# Patient Record
Sex: Male | Born: 1995 | Race: White | Hispanic: No | Marital: Single | Smoking: Former smoker
Health system: Southern US, Community
[De-identification: ages and names within clinical notes are randomized; demographics above are authoritative.]

## PROBLEM LIST (undated history)

## (undated) DIAGNOSIS — T7840XA Allergy, unspecified, initial encounter: Secondary | ICD-10-CM

## (undated) DIAGNOSIS — M797 Fibromyalgia: Secondary | ICD-10-CM

## (undated) DIAGNOSIS — F319 Bipolar disorder, unspecified: Secondary | ICD-10-CM

## (undated) DIAGNOSIS — F909 Attention-deficit hyperactivity disorder, unspecified type: Secondary | ICD-10-CM

## (undated) HISTORY — PX: TYMPANOSTOMY TUBE PLACEMENT: SHX32

## (undated) HISTORY — DX: Allergy, unspecified, initial encounter: T78.40XA

## (undated) HISTORY — DX: Fibromyalgia: M79.7

---
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
## 2017-01-04 ENCOUNTER — Ambulatory Visit (INDEPENDENT_AMBULATORY_CARE_PROVIDER_SITE_OTHER): Payer: 59 | Admitting: Family Medicine

## 2017-01-04 VITALS — BP 126/82 | HR 81 | Temp 97.8°F | Wt 157.4 lb

## 2017-01-04 DIAGNOSIS — R1013 Epigastric pain: Secondary | ICD-10-CM | POA: Diagnosis not present

## 2017-01-04 DIAGNOSIS — R5383 Other fatigue: Secondary | ICD-10-CM

## 2017-01-04 DIAGNOSIS — Z79899 Other long term (current) drug therapy: Secondary | ICD-10-CM

## 2017-01-04 DIAGNOSIS — F319 Bipolar disorder, unspecified: Secondary | ICD-10-CM

## 2017-01-04 DIAGNOSIS — G8929 Other chronic pain: Secondary | ICD-10-CM

## 2017-01-04 DIAGNOSIS — M5442 Lumbago with sciatica, left side: Secondary | ICD-10-CM | POA: Diagnosis not present

## 2017-01-04 MED ORDER — GI COCKTAIL ~~LOC~~
30.0000 mL | Freq: Once | ORAL | Status: AC
  Administered 2017-01-04: 30 mL via ORAL

## 2017-01-04 NOTE — Progress Notes (Signed)
George Pham is a 21 y.o. male [facility]   Patient Care Team: George Pham, George Eaves, DO as PCP - General (Family Medicine)   History of Present Illness:   HPI: See Assessment and Plan section for Problem Based Charting of issues discussed today.  Health Maintenance Due  Topic Date Due  . TETANUS/TDAP  06/11/2016  . INFLUENZA VACCINE  09/08/2016   Depression screen PHQ 2/9 01/04/2017  Decreased Interest 0  Down, Depressed, Hopeless 1  PHQ - 2 Score 1   PMHx, SurgHx, SocialHx, Medications, and Allergies were reviewed in the Visit Navigator and updated as appropriate.   Past Medical History:  Diagnosis Date  . ADHD (attention deficit hyperactivity disorder)   . Allergy   . Bipolar 1 disorder (HCC)    Followed by Dr. Yetta Pham    Past Surgical History:  Procedure Laterality Date  . TYMPANOSTOMY TUBE PLACEMENT      Family History  Problem Relation Age of Onset  . Hypertension Mother   . Cancer Mother   . Cancer Father   . Hyperlipidemia Father   . Cancer Other    Social History   Tobacco Use  . Smoking status: Light Tobacco Smoker  . Smokeless tobacco: Never Used  Substance Use Topics  . Alcohol use: No    Alcohol/week: 0.0 oz  . Drug use: Yes    Types: Marijuana   Current Medications and Allergies:   .  ARIPiprazole (ABILIFY) 15 MG tablet, Take 15 mg by mouth daily., Disp: , Rfl:  .  EPINEPHrine (EPI-PEN) 0.3 mg/0.3 mL SOAJ injection, Inject into the muscle once., Disp: , Rfl:  .  fexofenadine (ALLEGRA) 30 MG tablet, Take 30 mg by mouth daily., Disp: , Rfl:  .  omeprazole (PRILOSEC) 20 MG capsule, Take 20 mg by mouth daily., Disp: , Rfl:   Allergies  Allergen Reactions  . Peanuts [Peanut Oil]     "Ears turn red."    Review of Systems:   Pertinent items are noted in the HPI. Otherwise, ROS is negative.  Vitals:   Vitals:   01/04/17 1041  BP: 126/82  Pulse: 81  Temp: 97.8 F (36.6 C)  TempSrc: Oral  Weight: 157 lb 6.4 oz (71.4 kg)      Body mass index is 23.52 kg/m.   Physical Exam:   Physical Exam  Constitutional: He is oriented to person, place, and time. He appears well-developed and well-nourished. No distress.  HENT:  Head: Normocephalic and atraumatic.  Right Ear: External ear normal.  Left Ear: External ear normal.  Nose: Nose normal.  Mouth/Throat: Oropharynx is clear and moist.  Eyes: Conjunctivae and EOM are normal. Pupils are equal, round, and reactive to light.  Neck: Normal range of motion. Neck supple.  Cardiovascular: Normal rate, regular rhythm, normal heart sounds and intact distal pulses.  Pulmonary/Chest: Effort normal and breath sounds normal.  Abdominal: Soft. Bowel sounds are normal.  Musculoskeletal: Normal range of motion.  Neurological: He is alert and oriented to person, place, and time.  Skin: Skin is warm and dry.  Psychiatric: He has a normal mood and affect. His behavior is normal. Judgment and thought content normal.  Nursing note and vitals reviewed.  Assessment and Plan:   George Pham was seen today for establish care and emesis.  Diagnoses and all orders for this visit:  Epigastric pain Comments: None today.  The patient states that he has a long history of GI complaints.  He denies any nausea, vomiting,  diarrhea, constipation.  No hematochezia, hematemesis, or melena.  Upcoming appointment with GI - Dr. Dulce Pham.   -     gi cocktail (Maalox,Lidocaine,Donnatal)  Medication management Comments: Patient is on a second generation antipsychotic.  Will monitor for metabolic syndrome. Orders: -     CBC; Future -     Comprehensive metabolic panel; Future -     Lipid panel; Future -     TSH; Future  Fatigue, unspecified type Comments: Patient does seem very tired today.  I am not aware of his baseline.  We discussed sleep hygiene.  Labs pending.  He will follow-up with a psychiatrist regarding his Abilify dosage.  Chronic left-sided low back pain with left-sided  sciatica Comments: None today.  Patient has a history of left-sided lumbar pain with intermittent left sciatica.  He denies any trauma recently or remotely.  He does endorse some mild scoliosis.  We reviewed exercises to complete to strengthen the low back.  Bipolar 1 disorder (HCC) Comments: Followed by Psych - Dr. Yetta Pham.  See above.  He denies any SI/HI today.  . Reviewed expectations re: course of current medical issues. . Discussed self-management of symptoms. . Outlined signs and symptoms indicating need for more acute intervention. . Patient verbalized understanding and all questions were answered. Marland Kitchen. Health Maintenance issues including appropriate healthy diet, exercise, and smoking avoidance were discussed with patient. . See orders for this visit as documented in the electronic medical record. . Patient received an After Visit Summary.  Records requested if needed. Time spent with the patient: 45 minutes, of which >50% was spent in obtaining information about his symptoms, reviewing his previous labs, evaluations, and treatments, counseling him about his condition (please see the discussed topics above), and developing a plan to further investigate it; he had a number of questions which I addressed.    George RimaErica Antwane Grose, DO [place] 01/09/2017

## 2017-01-06 ENCOUNTER — Encounter: Payer: Self-pay | Admitting: Family Medicine

## 2017-01-06 DIAGNOSIS — G8929 Other chronic pain: Secondary | ICD-10-CM | POA: Insufficient documentation

## 2017-01-06 DIAGNOSIS — R1013 Epigastric pain: Secondary | ICD-10-CM | POA: Insufficient documentation

## 2017-01-06 DIAGNOSIS — M5442 Lumbago with sciatica, left side: Secondary | ICD-10-CM

## 2017-01-06 DIAGNOSIS — Z79899 Other long term (current) drug therapy: Secondary | ICD-10-CM | POA: Insufficient documentation

## 2017-10-06 ENCOUNTER — Ambulatory Visit (INDEPENDENT_AMBULATORY_CARE_PROVIDER_SITE_OTHER): Payer: 59 | Admitting: Psychology

## 2017-10-06 DIAGNOSIS — F3181 Bipolar II disorder: Secondary | ICD-10-CM

## 2017-10-26 ENCOUNTER — Ambulatory Visit (INDEPENDENT_AMBULATORY_CARE_PROVIDER_SITE_OTHER): Payer: 59 | Admitting: Family Medicine

## 2017-10-26 ENCOUNTER — Encounter: Payer: Self-pay | Admitting: Family Medicine

## 2017-10-26 VITALS — BP 122/80 | HR 62 | Temp 98.1°F | Ht 70.0 in | Wt 147.4 lb

## 2017-10-26 DIAGNOSIS — J302 Other seasonal allergic rhinitis: Secondary | ICD-10-CM | POA: Diagnosis not present

## 2017-10-26 DIAGNOSIS — R5383 Other fatigue: Secondary | ICD-10-CM

## 2017-10-26 DIAGNOSIS — R5381 Other malaise: Secondary | ICD-10-CM

## 2017-10-26 DIAGNOSIS — Z0000 Encounter for general adult medical examination without abnormal findings: Secondary | ICD-10-CM | POA: Diagnosis not present

## 2017-10-26 DIAGNOSIS — F319 Bipolar disorder, unspecified: Secondary | ICD-10-CM

## 2017-10-26 DIAGNOSIS — Z79899 Other long term (current) drug therapy: Secondary | ICD-10-CM

## 2017-10-26 DIAGNOSIS — Z23 Encounter for immunization: Secondary | ICD-10-CM

## 2017-10-26 LAB — COMPREHENSIVE METABOLIC PANEL
ALT: 12 U/L (ref 0–53)
AST: 13 U/L (ref 0–37)
Albumin: 4.6 g/dL (ref 3.5–5.2)
Alkaline Phosphatase: 56 U/L (ref 39–117)
BUN: 13 mg/dL (ref 6–23)
CO2: 31 mEq/L (ref 19–32)
Calcium: 9.9 mg/dL (ref 8.4–10.5)
Chloride: 103 mEq/L (ref 96–112)
Creatinine, Ser: 0.69 mg/dL (ref 0.40–1.50)
GFR: 154.79 mL/min (ref >60.00)
Glucose, Bld: 88 mg/dL (ref 70–99)
Potassium: 4.2 mEq/L (ref 3.5–5.1)
Sodium: 138 mEq/L (ref 135–145)
Total Bilirubin: 1 mg/dL (ref 0.2–1.2)
Total Protein: 7.3 g/dL (ref 6.0–8.3)

## 2017-10-26 LAB — LIPID PANEL
Cholesterol: 103 mg/dL (ref 0–200)
HDL: 40.1 mg/dL (ref >39.00)
LDL Cholesterol: 54 mg/dL (ref 0–99)
NonHDL: 63.19
Total CHOL/HDL Ratio: 3
Triglycerides: 47 mg/dL (ref 0.0–149.0)
VLDL: 9.4 mg/dL (ref 0.0–40.0)

## 2017-10-26 LAB — CBC
HCT: 43.4 % (ref 39.0–52.0)
Hemoglobin: 14.9 g/dL (ref 13.0–17.0)
MCHC: 34.3 g/dL (ref 30.0–36.0)
MCV: 93.4 fl (ref 78.0–100.0)
Platelets: 240 10*3/uL (ref 150.0–400.0)
RBC: 4.65 Mil/uL (ref 4.22–5.81)
RDW: 12.6 % (ref 11.5–14.6)
WBC: 6 10*3/uL (ref 4.5–10.5)

## 2017-10-26 LAB — TSH: TSH: 1.78 u[IU]/mL (ref 0.35–5.50)

## 2017-10-26 NOTE — Progress Notes (Signed)
Subjective:    George Pham is a 22 y.o. male who presents today for his Complete Annual Exam.   Current Outpatient Medications:  .  ARIPiprazole (ABILIFY) 15 MG tablet, Take 15 mg by mouth daily., Disp: , Rfl:  .  clonazePAM (KLONOPIN) 1 MG tablet, , Disp: , Rfl:  .  EPINEPHrine (EPI-PEN) 0.3 mg/0.3 mL SOAJ injection, Inject into the muscle once., Disp: , Rfl:  .  fexofenadine (ALLEGRA) 30 MG tablet, Take 30 mg by mouth daily., Disp: , Rfl:  .  omeprazole (PRILOSEC) 20 MG capsule, Take 20 mg by mouth daily., Disp: , Rfl:   Health Maintenance Due  Topic Date Due  . TETANUS/TDAP  06/11/2016  . INFLUENZA VACCINE  09/08/2017   PMHx, SurgHx, SocialHx, Medications, and Allergies were reviewed in the Visit Navigator and updated as appropriate.   Past Medical History:  Diagnosis Date  . ADHD (attention deficit hyperactivity disorder)   . Allergy   . Bipolar 1 disorder (HCC)    Followed by Dr. Yetta Barre     Past Surgical History:  Procedure Laterality Date  . TYMPANOSTOMY TUBE PLACEMENT       Family History  Problem Relation Age of Onset  . Hypertension Mother   . Cancer Mother   . Cancer Father   . Hyperlipidemia Father   . Cancer Other     Social History   Tobacco Use  . Smoking status: Light Tobacco Smoker  . Smokeless tobacco: Never Used  Substance Use Topics  . Alcohol use: No    Alcohol/week: 0.0 standard drinks  . Drug use: Yes    Types: Marijuana    Review of Systems:   Pertinent items are noted in the HPI. Otherwise, ROS is negative.  Objective:   Vitals:   10/26/17 1053  BP: 122/80  Pulse: 62  Temp: 98.1 F (36.7 C)  SpO2: 99%   Body mass index is 21.15 kg/m.  General Appearance:  Alert, cooperative, no distress, appears stated age  Head:  Normocephalic, without obvious abnormality, atraumatic  Eyes:  PERRL, conjunctiva/corneas clear, EOM's intact, fundi benign, both eyes       Ears:  Normal TM's and external ear canals, both ears    Nose: Nares normal, septum midline, mucosa normal, no drainage    or sinus tenderness  Throat: Lips, mucosa, and tongue normal; teeth and gums normal  Neck: Supple, symmetrical, trachea midline, no adenopathy; thyroid:  No enlargement/tenderness/nodules; no carotit bruit or JVD  Back:   Symmetric, no curvature, ROM normal, no CVA tenderness  Lungs:   Clear to auscultation bilaterally, respirations unlabored  Chest wall:  No tenderness or deformity  Heart:  Regular rate and rhythm, S1 and S2 normal, no murmur, rub   or gallop  Abdomen:   Soft, non-tender, bowel sounds active all four quadrants, no masses, no organomegaly  Extremities: Extremities normal, atraumatic, no cyanosis or edema  Prostate: Not done.   Skin: Skin color, texture, turgor normal, no rashes or lesions  Lymph nodes: Cervical, supraclavicular, and axillary nodes normal  Neurologic: CNII-XII grossly intact. Normal strength, sensation and reflexes throughout    Assessment/Plan:   Diagnoses and all orders for this visit:  Routine physical examination  Medication management Comments: On antipsychotic.  Orders: -     TSH -     Lipid panel -     Comprehensive metabolic panel -     CBC  Need for prophylactic vaccination against diphtheria-tetanus-pertussis (DTP) -     Tdap  vaccine greater than or equal to 7yo IM  Bipolar 1 disorder (HCC)  Seasonal allergies  Malaise and fatigue -     TSH -     Comprehensive metabolic panel -     CBC   Patient Counseling: [x]   Nutrition: Stressed importance of moderation in sodium/caffeine intake, saturated fat and cholesterol, caloric balance, sufficient intake of fresh fruits, vegetables, and fiber.  [x]   Stressed the importance of regular exercise.   []   Substance Abuse: Discussed cessation/primary prevention of tobacco, alcohol, or other drug use; driving or other dangerous activities under the influence; availability of treatment for abuse.   [x]   Injury prevention:  Discussed safety belts, safety helmets, smoke detector, smoking near bedding or upholstery.   []   Sexuality: Discussed sexually transmitted diseases, partner selection, use of condoms, avoidance of unintended pregnancy and contraceptive alternatives.   [x]   Dental health: Discussed importance of regular tooth brushing, flossing, and dental visits.  [x]   Health maintenance and immunizations reviewed. Please refer to Health maintenance section.    Helane RimaErica Yasaman Kolek, DO [place] [facility]

## 2017-10-31 ENCOUNTER — Ambulatory Visit: Payer: 59 | Admitting: Psychology

## 2017-11-16 ENCOUNTER — Ambulatory Visit: Payer: 59 | Admitting: Psychology

## 2017-11-21 ENCOUNTER — Ambulatory Visit: Payer: Self-pay | Admitting: Psychology

## 2018-03-27 ENCOUNTER — Telehealth: Payer: Self-pay | Admitting: Family Medicine

## 2018-03-27 NOTE — Telephone Encounter (Signed)
Called mom let her know that he does not at this time

## 2018-03-27 NOTE — Telephone Encounter (Signed)
Copied from CRM [id number]. Topic: General - Other >> Mar 27, 2018 11:39 AM Gaynelle Adu wrote: Reason for CRM: patient mother is calling to request a Tdap  shot. Please advise

## 2018-08-09 ENCOUNTER — Telehealth: Payer: Self-pay
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
## 2018-08-09 NOTE — Telephone Encounter (Signed)
Noted  
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
## 2018-08-10 ENCOUNTER — Telehealth: Payer: Self-pay | Admitting: Family Medicine

## 2018-08-10 ENCOUNTER — Ambulatory Visit (INDEPENDENT_AMBULATORY_CARE_PROVIDER_SITE_OTHER): Payer: 59

## 2018-08-10 ENCOUNTER — Ambulatory Visit (INDEPENDENT_AMBULATORY_CARE_PROVIDER_SITE_OTHER): Payer: 59 | Admitting: Physician Assistant

## 2018-08-10 ENCOUNTER — Encounter: Payer: Self-pay | Admitting: Physician Assistant

## 2018-08-10 ENCOUNTER — Other Ambulatory Visit: Payer: Self-pay

## 2018-08-10 VITALS — BP 126/78 | HR 64 | Temp 97.7°F | Ht 70.0 in | Wt 150.2 lb

## 2018-08-10 DIAGNOSIS — M25562 Pain in left knee: Secondary | ICD-10-CM | POA: Diagnosis not present

## 2018-08-10 DIAGNOSIS — M25512 Pain in left shoulder: Secondary | ICD-10-CM

## 2018-08-10 DIAGNOSIS — T07XXXA Unspecified multiple injuries, initial encounter: Secondary | ICD-10-CM

## 2018-08-10 MED ORDER — MELOXICAM 15 MG PO TABS: 15.0000 mg | ORAL_TABLET | Freq: Every day | ORAL | 0 refills | Status: DC

## 2018-08-10 NOTE — Telephone Encounter (Signed)
Patient returning call for results. CRM was created by office for nurse triage to be able to give results if patient calls back. Please advise.
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
## 2018-08-10 NOTE — Patient Instructions (Signed)
It was great to see you!  Ice is your friend!  Start daily mobic and take for at least one week to help with inflammation. This will replace ibuprofen.  You will be contacted about your referral to sports medicine and your xray results.  Take care,  Inda Coke PA-C

## 2018-09-12 ENCOUNTER — Ambulatory Visit: Payer: 59 | Admitting: Family Medicine

## 2018-11-09 ENCOUNTER — Ambulatory Visit (INDEPENDENT_AMBULATORY_CARE_PROVIDER_SITE_OTHER): Payer: 59 | Admitting: Family Medicine

## 2018-11-09 ENCOUNTER — Other Ambulatory Visit: Payer: Self-pay

## 2018-11-09 ENCOUNTER — Encounter: Payer: Self-pay | Admitting: Family Medicine

## 2018-11-09 VITALS — Ht 70.0 in | Wt 150.0 lb

## 2018-11-09 DIAGNOSIS — K602 Anal fissure, unspecified: Secondary | ICD-10-CM

## 2018-11-09 MED ORDER — MUPIROCIN 2 % EX OINT: 1.0000 "application " | TOPICAL_OINTMENT | Freq: Two times a day (BID) | CUTANEOUS | 0 refills | Status: DC

## 2018-11-09 MED ORDER — LIDOCAINE 5 % EX OINT: 1.0000 "application " | TOPICAL_OINTMENT | CUTANEOUS | 0 refills | Status: DC | PRN

## 2018-11-09 NOTE — Patient Instructions (Signed)
Anal Fissure, Adult  An anal fissure is a small tear or crack in the tissue around the opening of the butt (anus). Bleeding from the tear or crack usually stops on its own within a few minutes. The bleeding may happen every time you poop (have a bowel movement) until the tear or crack heals. What are the causes? This condition is usually caused by passing a large or hard poop (stool). Other causes include:  Trouble pooping (constipation).  Passing watery poop (diarrhea).  Inflammatory bowel disease (Crohn's disease or ulcerative colitis).  Childbirth.  Infections.  Anal sex. What are the signs or symptoms? Symptoms of this condition include:  Bleeding from the butt.  Small amounts of blood on your poop. The blood coats the outside of the poop. It is not mixed with the poop.  Small amounts of blood on the toilet paper or in the toilet after you poop.  Pain when passing poop.  Itching or irritation around the opening of the butt. How is this diagnosed? This condition may be diagnosed based on a physical exam. Your doctor may:  Check your butt. A tear can often be seen by checking the area with care.  Check your butt using a short tube (anoscope). The light in the tube will show any problems in your butt. How is this treated? Treatment for this condition may include:  Treating problems that make it hard for you to pass poop. You may be told to: ? Eat more fiber. ? Drink more fluid. ? Take fiber supplements. ? Take medicines that make poop soft.  Taking sitz baths. This may help to heal the tear.  Using creams and ointments. If your condition gets worse, other treatments may be needed such as:  A shot near the tear or crack (botulinum injection).  Surgery to repair the tear or crack. Follow these instructions at home: Eating and drinking   Avoid bananas and dairy products. These foods can make it hard to poop.  Drink enough fluid to keep your pee (urine) pale  yellow.  Eat foods that have a lot of fiber in them, such as: ? Beans. ? Whole grains. ? Fresh fruits. ? Fresh vegetables. General instructions   Take over-the-counter and prescription medicines only as told by your doctor.  Use creams or ointments only as told by your doctor.  Keep the butt area as clean and dry as you can.  Take a warm water bath (sitz bath) as told by your doctor. Do not use soap.  Keep all follow-up visits as told by your doctor. This is important. Contact a doctor if:  You have more bleeding.  You have a fever.  You have watery poop that is mixed with blood.  You have pain.  Your problem gets worse, not better. Summary  An anal fissure is a small tear or crack in the skin around the opening of the butt (anus).  This condition is usually caused by passing a large or hard poop (stool).  Treatment includes treating the problems that make it hard for you to pass poop.  Follow your doctor's instructions about caring for your condition at home.  Keep all follow-up visits as told by your doctor. This is important. This information is not intended to replace advice given to you by your health care provider. Make sure you discuss any questions you have with your health care provider. Document Released: 09/23/2010 Document Revised: 07/07/2017 Document Reviewed: 07/07/2017 Elsevier Patient Education  2020 [employer]  

## 2018-11-09 NOTE — Progress Notes (Signed)
Virtual Visit via Video   Due to the COVID-19 pandemic, this visit was completed with telemedicine (audio/video) technology to reduce patient and provider exposure as well as to preserve personal protective equipment.   I connected with George Pham by a video enabled telemedicine application and verified that I am speaking with the correct person using two identifiers. Location patient: Home Location provider: [facility], Office Persons participating in the virtual visit: George Pham, George Deutscher, DO   I discussed the limitations of evaluation and management by telemedicine and the availability of in person appointments. The patient expressed understanding and agreed to proceed.  Care Team   Patient Care Team: George Deutscher, DO as PCP - General (Family Medicine)  Subjective:   HPI: Ongoing rectal pain for about a week. Has increased in the last week. Has tried over the counter medications and creams with no help. No blood in stool has had some discomfort with bowel movements. He feel like it may be a more of a fissure. Nt history of this in the past.    Review of Systems  Constitutional: Negative for chills and fever.  HENT: Negative for hearing loss and tinnitus.   Eyes: Negative for blurred vision and double vision.  Respiratory: Negative for cough.   Cardiovascular: Negative for chest pain, palpitations and leg swelling.  Gastrointestinal: Negative for nausea and vomiting.  Genitourinary: Negative for dysuria and urgency.  Neurological: Negative for dizziness and headaches.  Psychiatric/Behavioral: Negative for depression and suicidal ideas.    Patient Active Problem List   Diagnosis Date Noted  . Chronic left-sided low back pain with left-sided sciatica 01/06/2017  . Epigastric pain 01/06/2017  . Medication management 01/06/2017  . Attention deficit hyperactivity disorder (ADHD) 06/16/2015  . Bipolar 1 disorder ([place]) 06/16/2015  . Seasonal allergies  06/16/2015    Social History   Tobacco Use  . Smoking status: Light Tobacco Smoker  . Smokeless tobacco: Never Used  Substance Use Topics  . Alcohol use: No    Alcohol/week: 0.0 standard drinks    Current Outpatient Medications:  .  ABILIFY 30 MG tablet, Take 30 mg by mouth daily., Disp: , Rfl:  .  clonazePAM (KLONOPIN) 1 MG tablet, , Disp: , Rfl:  .  EPINEPHrine (EPI-PEN) 0.3 mg/0.3 mL SOAJ injection, Inject into the muscle once., Disp: , Rfl:  .  fexofenadine (ALLEGRA) 30 MG tablet, Take 30 mg by mouth daily., Disp: , Rfl:  .  hydrOXYzine (VISTARIL) 50 MG capsule, TAKE 1 CAPSULE BY MOUTH 4 TIMES A DAY, Disp: , Rfl:  .  meloxicam (MOBIC) 15 MG tablet, Take 1 tablet (15 mg total) by mouth daily., Disp: 30 tablet, Rfl: 0 .  omeprazole (PRILOSEC) 20 MG capsule, Take 20 mg by mouth daily., Disp: , Rfl:  .  lidocaine (XYLOCAINE) 5 % ointment, Apply 1 application topically as needed., Disp: 35.44 g, Rfl: 0 .  mupirocin ointment (BACTROBAN) 2 %, Place 1 application into the nose 2 (two) times daily., Disp: 22 g, Rfl: 0  Allergies  Allergen Reactions  . Peanuts [Peanut Oil]     "Ears turn red."     Objective:   VITALS: Per patient if applicable, see vitals. GENERAL: Alert, appears well and in no acute distress. HEENT: Atraumatic, conjunctiva clear, no obvious abnormalities on inspection of external nose and ears. NECK: Normal movements of the head and neck. CARDIOPULMONARY: No increased WOB. Speaking in clear sentences. I:E ratio WNL.  MS: Moves all visible extremities without noticeable  abnormality. PSYCH: Pleasant and cooperative, well-groomed. Speech normal rate and rhythm. Affect is appropriate. Insight and judgement are appropriate. Attention is focused, linear, and appropriate.  NEURO: CN grossly intact. Oriented as arrived to appointment on time with no prompting. Moves both UE equally.  SKIN: No obvious lesions, wounds, erythema, or cyanosis noted on face or  hands.  Depression screen [facility] 2/9 01/04/2017 12/18/2015 06/16/2015  Decreased Interest 0 0 0  Down, Depressed, Hopeless 1 1 0  PHQ - 2 Score 1 1 0    Assessment and Plan:   George Pham was seen today for hemorrhoids.  Diagnoses and all orders for this visit:  Anal fissure -     lidocaine (XYLOCAINE) 5 % ointment; Apply 1 application topically as needed. -     mupirocin ointment (BACTROBAN) 2 %; Place 1 application into the nose 2 (two) times daily.   Marland Kitchen COVID-19 Education: The signs and symptoms of COVID-19 were discussed with the patient and how to seek care for testing if needed. The importance of social distancing was discussed today. . Reviewed expectations re: course of current medical issues. . Discussed self-management of symptoms. . Outlined signs and symptoms indicating need for more acute intervention. . Patient verbalized understanding and all questions were answered. Marland Kitchen Health Maintenance issues including appropriate healthy diet, exercise, and smoking avoidance were discussed with patient. . See orders for this visit as documented in the electronic medical record.  Helane Rima, DO  Records requested if needed. Time spent: 15 minutes, of which >50% was spent in obtaining information about his symptoms, reviewing his previous labs, evaluations, and treatments, counseling him about his condition (please see the discussed topics above), and developing a plan to further investigate it; he had a number of questions which I addressed.

## 2018-11-17 ENCOUNTER — Other Ambulatory Visit: Payer: Self-pay | Admitting: Family Medicine

## 2018-11-17 DIAGNOSIS — K602 Anal fissure, unspecified: Secondary | ICD-10-CM

## 2018-11-30 ENCOUNTER — Other Ambulatory Visit: Payer: Self-pay | Admitting: Family Medicine

## 2018-11-30 DIAGNOSIS — K602 Anal fissure, unspecified: Secondary | ICD-10-CM

## 2018-12-08 ENCOUNTER — Ambulatory Visit: Payer: 59 | Admitting: Family Medicine

## 2018-12-18 ENCOUNTER — Other Ambulatory Visit: Payer: Self-pay | Admitting: Family Medicine

## 2018-12-18 DIAGNOSIS — K602 Anal fissure, unspecified: Secondary | ICD-10-CM

## 2018-12-20 NOTE — Telephone Encounter (Signed)
Refilled denied. Please contact pt to schedule with new PCP.

## 2018-12-22 NOTE — Telephone Encounter (Signed)
Patient has been scheduled with Dr. Rogers Blocker 02/14/19

## 2019-02-13 ENCOUNTER — Other Ambulatory Visit: Payer: Self-pay

## 2019-02-14 ENCOUNTER — Encounter: Payer: Self-pay | Admitting: Family Medicine

## 2019-02-14 ENCOUNTER — Ambulatory Visit (INDEPENDENT_AMBULATORY_CARE_PROVIDER_SITE_OTHER): Payer: Managed Care, Other (non HMO) | Admitting: Family Medicine

## 2019-02-14 VITALS — BP 103/64 | HR 70 | Temp 97.8°F | Ht 70.0 in | Wt 135.6 lb

## 2019-02-14 DIAGNOSIS — Z0000 Encounter for general adult medical examination without abnormal findings: Secondary | ICD-10-CM | POA: Diagnosis not present

## 2019-02-14 DIAGNOSIS — G44209 Tension-type headache, unspecified, not intractable: Secondary | ICD-10-CM

## 2019-02-14 DIAGNOSIS — M2550 Pain in unspecified joint: Secondary | ICD-10-CM | POA: Diagnosis not present

## 2019-02-14 NOTE — Progress Notes (Signed)
Patient: George Pham MRN: [id number] DOB: 02-Oct-1995 PCP: Orland Mustard, MD     Subjective:  Chief Complaint  Patient presents with  . Transitions Of Care  . Annual Exam  . Headache  . body pain    HPI: The patient is a 24 y.o. male who presents today for annual exam and multiple complaints. .  The patient is a 24 year old male who presents today for annual exam. He denies any changes to past medical history. There have been no recent hospitalizations. They are not following a well balanced diet and exercise plan. Weight has been decreasing steadily. Sexually active with male partner. Has an 73 month old son.    He has a few complaints to address today. achiness in joints and chronic headaches  Headaches: First started shortly after his son came home (about 8 months ago). Headaches start in the back of his head and comes around both sides and progressively gets worse though the day. Happens about every other day. Pain is an 8/10. described as throbbing. Laying down and burrowing head into pillow seems to help. Loud noises, sunlight make it worse. No N/V with it. Sleep is erratic. He does not drink a lot of water. He does drink a lot of soda. He does not drink alcohol very often. He is stressed (in his head)/relationships. He denies any vision changes, does not wake him up in the middle of the night, no focal deficits, he does feel like these are the worst headaches he has ever had. He has taken tylenol and it helps as does ibuprofen.   Joint pain: he feels like his body hurts and his joints hurt. Shoulders/legs/arms/hands/back. He has hx of arthritis in his family and RA in his mother. The ibuprofen does help this pain. He does exercise by walking and push ups. No swollen joints/red joints. He does feel like he has been losing weight unintentionally. He has lost 16 pounds in the last year.  He has been on abilify for a large portion of his life.    Review of Systems  Constitutional:  Negative for chills, fatigue and fever.  HENT: Negative for dental problem, ear pain, hearing loss and trouble swallowing.   Eyes: Negative for visual disturbance.  Respiratory: Negative for cough, chest tightness and shortness of breath.   Cardiovascular: Negative for chest pain, palpitations and leg swelling.  Gastrointestinal: Negative for abdominal pain, blood in stool, diarrhea and nausea.  Endocrine: Negative for cold intolerance, polydipsia, polyphagia and polyuria.  Genitourinary: Negative for dysuria and hematuria.  Musculoskeletal: Positive for arthralgias and back pain. Negative for gait problem, joint swelling and neck stiffness.  Skin: Negative for rash.  Neurological: Positive for headaches. Negative for dizziness, facial asymmetry and numbness.  Psychiatric/Behavioral: Negative for dysphoric mood and sleep disturbance. The patient is not nervous/anxious.     Allergies Patient is allergic to peanuts [peanut oil].  Past Medical History Patient  has a past medical history of ADHD (attention deficit hyperactivity disorder), Allergy, and Bipolar 1 disorder (H[facility]).  Surgical History Patient  has a past surgical history that includes Tympanostomy tube placement.  Family History Pateint's family history includes Cancer in his father, mother, and another family member; Hyperlipidemia in his father; Hypertension in his mother.  Social History Patient  reports that he has been smoking cigarettes. He has never used smokeless tobacco. He reports current drug use. Drug: Marijuana. He reports that he does not drink alcohol.    Objective: Vitals:   02/14/19  1430  BP: 103/64  Pulse: 70  Temp: 97.8 F (36.6 C)  TempSrc: Temporal  SpO2: 98%  Weight: 135 lb 9.6 oz (61.5 kg)  Height: 5\' 10"  (1.778 m)    Body mass index is 19.46 kg/m.  Physical Exam Vitals reviewed.  Constitutional:      General: He is not in acute distress.    Appearance: He is normal weight.  HENT:      Head: Normocephalic and atraumatic.     Mouth/Throat:     Mouth: Mucous membranes are moist.  Eyes:     Extraocular Movements: Extraocular movements intact.     Pupils: Pupils are equal, round, and reactive to light.  Cardiovascular:     Rate and Rhythm: Normal rate and regular rhythm.     Heart sounds: Normal heart sounds. No murmur.  Pulmonary:     Effort: Pulmonary effort is normal.     Breath sounds: Normal breath sounds.  Abdominal:     General: Bowel sounds are normal.     Palpations: Abdomen is soft.  Musculoskeletal:        General: Tenderness present. No swelling.     Cervical back: Normal range of motion and neck supple.     Comments: Hand grip wnl. No joint erythema of joints or edema. He does have tenderness in majority of fibromylagia trigger spots. Finger joints slightly hypermobile, but no other findings in other joints.   Skin:    General: Skin is warm and dry.  Neurological:     Mental Status: He is alert and oriented to person, place, and time.     Cranial Nerves: No cranial nerve deficit, dysarthria or facial asymmetry.     Sensory: No sensory deficit.     Motor: No weakness.     Deep Tendon Reflexes: Reflexes normal.  Psychiatric:        Mood and Affect: Mood normal. Mood is not anxious or depressed.        Speech: Speech normal.        Behavior: Behavior is not agitated.        Assessment/plan: 1. Annual physical exam Routine lab work today. Need to ask about STD screening/HIV screen at next visit if new sex partner. hiv last tested in 2017. Otherwise utd on HM. Daily routine, 8 hours of sleep, limited screen time, increased water intake all encouraged.  Patient counseling [x]    Nutrition: Stressed importance of moderation in sodium/caffeine intake, saturated fat and cholesterol, caloric balance, sufficient intake of fresh fruits, vegetables, fiber, calcium, iron, and 1 mg of folate supplement per day (for females capable of pregnancy).  [x]    Stressed the  importance of regular exercise.   []    Substance Abuse: Discussed cessation/primary prevention of tobacco, alcohol, or other drug use; driving or other dangerous activities under the influence; availability of treatment for abuse.   [x]    Injury prevention: Discussed safety belts, safety helmets, smoke detector, smoking near bedding or upholstery.   [x]    Sexuality: Discussed sexually transmitted diseases, partner selection, use of condoms, avoidance of unintended pregnancy  and contraceptive alternatives.  [x]    Dental health: Discussed importance of regular tooth brushing, flossing, and dental visits.  [x]    Health maintenance and immunizations reviewed. Please refer to Health maintenance section.    - CBC with Differential/Platelet - Comprehensive metabolic panel - Lipid panel - TSH - VITAMIN D 25 Hydroxy (Vit-D Deficiency, Fractures)  2. Arthralgia, unspecified joint I feel like he has more fibromylagia  symptoms and exam most consistent with this. Checking labs and will go from there. Handout given and I want him to look over this. Will discuss treatment at f/u in one month after I get labs back. Did encourage more exercise.  - ANA w/reflex - Rheumatoid factor - Sedimentation rate - C-reactive protein  3. Tension headache Start a headache log for me. Poor sleep, no routine, limited exercise, excess caffeine. Handout given on lifestyle modifications to make to improve this. Continue with tylenol and nsaids prn. Do not want him using nsaids daily. ? If he has fibromyalgia and this could also be contributing. F/u with me in one month. Red flags/precatuions given for headaches that need emergent attention.     > 30 minutes spent in face to face time outside of annual exam to address new and complex issues. Moderate decision making.   This visit occurred during the SARS-CoV-2 public health emergency.  Safety protocols were in place, including screening questions prior to the visit,  additional usage of staff PPE, and extensive cleaning of exam room while observing appropriate contact time as indicated for disinfecting solutions.     Return in about 1 month (around 03/17/2019) for headaches, bring headache log. Orland Mustard, MD [facility]   02/15/2019

## 2019-02-14 NOTE — Patient Instructions (Signed)
Tension Headache, Adult A tension headache is pain, pressure, or aching in your head. Tension headaches can last from 30 minutes to several days. Follow these instructions at home: Managing pain  Take over-the-counter and prescription medicines only as told by your doctor.  When you have a headache, lie down in a dark, quiet room.  If told, put ice on your head and neck: ? Put ice in a plastic bag. ? Place a towel between your skin and the bag. ? Leave the ice on for 20 minutes, 2-3 times a day.  If told, put heat on the back of your neck. Do this as often as your doctor tells you to. Use the kind of heat that your doctor recommends, such as a moist heat pack or a heating pad. ? Place a towel between your skin and the heat. ? Leave the heat on for 20-30 minutes. ? Remove the heat if your skin turns bright red. Eating and drinking  Eat meals on a regular schedule.  Watch how much alcohol you drink: ? If you are a woman and are not pregnant, do not drink more than 1 drink a day. ? If you are a man, do not drink more than 2 drinks a day.  Drink enough fluid to keep your pee (urine) pale yellow.  Do not use a lot of caffeine, or stop using caffeine. Lifestyle  Get enough sleep. Get 7-9 hours of sleep each night. Or get the amount of sleep that your doctor tells you to.  At bedtime, remove all electronic devices from your room. Examples of electronic devices are computers, phones, and tablets.  Find ways to lessen your stress. Some things that can lessen stress are: ? Exercise. ? Deep breathing. ? Yoga. ? Music. ? Positive thoughts.  Sit up straight. Do not tighten (tense) your muscles.  Do not use any products that have nicotine or tobacco in them, such as cigarettes and e-cigarettes. If you need help quitting, ask your doctor. General instructions   Keep all follow-up visits as told by your doctor. This is important.  Avoid things that can bring on headaches. Keep a  journal to find out if certain things bring on headaches. For example, write down: ? What you eat and drink. ? How much sleep you get. ? Any change to your diet or medicines. Contact a doctor if:  Your headache does not get better.  Your headache comes back.  You have a headache and sounds, light, or smells bother you.  You feel sick to your stomach (nauseous) or you throw up (vomit).  Your stomach hurts. Get help right away if:  You suddenly get a very bad headache along with any of these: ? A stiff neck. ? Feeling sick to your stomach. ? Throwing up. ? Feeling weak. ? Trouble seeing. ? Feeling short of breath. ? A rash. ? Feeling unusually sleepy. ? Trouble speaking. ? Pain in your eye or ear. ? Trouble walking or balancing. ? Feeling like you will pass out (faint). ? Passing out. Summary  A tension headache is pain, pressure, or aching in your head.  Tension headaches can last from 30 minutes to several days.  Lifestyle changes and medicines may help relieve pain. This information is not intended to replace advice given to you by your health care provider. Make sure you discuss any questions you have with your health care provider. Document Revised: 11/22/2018 Document Reviewed: 05/07/2016   Myofascial Pain Syndrome and Fibromyalgia Myofascial pain  syndrome and fibromyalgia are both pain disorders. This pain may be felt mainly in your muscles.  Myofascial pain syndrome: ? Always has tender points in the muscle that will cause pain when pressed (trigger points). The pain may come and go. ? Usually affects your neck, upper back, and shoulder areas. The pain often radiates into your arms and hands.  Fibromyalgia: ? Has muscle pains and tenderness that come and go. ? Is often associated with fatigue and sleep problems. ? Has trigger points. ? Tends to be long-lasting (chronic), but is not life-threatening. Fibromyalgia and myofascial pain syndrome are not the  same. However, they often occur together. If you have both conditions, each can make the other worse. Both are common and can cause enough pain and fatigue to make day-to-day activities difficult. Both can be hard to diagnose because their symptoms are common in many other conditions. What are the causes? The exact causes of these conditions are not known. What increases the risk? You are more likely to develop this condition if:  You have a family history of the condition.  You have certain triggers, such as: ? Spine disorders. ? An injury (trauma) or other physical stressors. ? Being under a lot of stress. ? Medical conditions such as osteoarthritis, rheumatoid arthritis, or lupus. What are the signs or symptoms? Fibromyalgia The main symptom of fibromyalgia is widespread pain and tenderness in your muscles. Pain is sometimes described as stabbing, shooting, or burning. You may also have:  Tingling or numbness.  Sleep problems and fatigue.  Problems with attention and concentration (fibro fog). Other symptoms may include:  Bowel and bladder problems.  Headaches.  Visual problems.  Problems with odors and noises.  Depression or mood changes.  Painful menstrual periods (dysmenorrhea).  Dry skin or eyes. These symptoms can vary over time. Myofascial pain syndrome Symptoms of myofascial pain syndrome include:  Tight, ropy bands of muscle.  Uncomfortable sensations in muscle areas. These may include aching, cramping, burning, numbness, tingling, and weakness.  Difficulty moving certain parts of the body freely (poor range of motion). How is this diagnosed? This condition may be diagnosed by your symptoms and medical history. You will also have a physical exam. In general:  Fibromyalgia is diagnosed if you have pain, fatigue, and other symptoms for more than 3 months, and symptoms cannot be explained by another condition.  Myofascial pain syndrome is diagnosed if you  have trigger points in your muscles, and those trigger points are tender and cause pain elsewhere in your body (referred pain). How is this treated? Treatment for these conditions depends on the type that you have.  For fibromyalgia: ? Pain medicines, such as NSAIDs. ? Medicines for treating depression. ? Medicines for treating seizures. ? Medicines that relax the muscles.  For myofascial pain: ? Pain medicines, such as NSAIDs. ? Cooling and stretching of muscles. ? Trigger point injections. ? Sound wave (ultrasound) treatments to stimulate muscles. Treating these conditions often requires a team of health care providers. These may include:  Your primary care provider.  Physical therapist.  Complementary health care providers, such as massage therapists or acupuncturists.  Psychiatrist for cognitive behavioral therapy. Follow these instructions at home: Medicines  Take over-the-counter and prescription medicines only as told by your health care provider.  Do not drive or use heavy machinery while taking prescription pain medicine.  If you are taking prescription pain medicine, take actions to prevent or treat constipation. Your health care provider may recommend that you: ?  Drink enough fluid to keep your urine pale yellow. ? Eat foods that are high in fiber, such as fresh fruits and vegetables, whole grains, and beans. ? Limit foods that are high in fat and processed sugars, such as fried or sweet foods. ? Take an over-the-counter or prescription medicine for constipation. Lifestyle   Exercise as directed by your health care provider or physical therapist.  Practice relaxation techniques to control your stress. You may want to try: ? Biofeedback. ? Visual imagery. ? Hypnosis. ? Muscle relaxation. ? Yoga. ? Meditation.  Maintain a healthy lifestyle. This includes eating a healthy diet and getting enough sleep.  Do not use any products that contain nicotine or  tobacco, such as cigarettes and e-cigarettes. If you need help quitting, ask your health care provider. General instructions  Talk to your health care provider about complementary treatments, such as acupuncture or massage.  Consider joining a support group with others who are diagnosed with this condition.  Do not do activities that stress or strain your muscles. This includes repetitive motions and heavy lifting.  Keep all follow-up visits as told by your health care provider. This is important. Where to find more information  National Fibromyalgia Association: www.fmaware.[place]: www.arthritis.org  American Chronic Pain Association: www.theacpa.org Contact a health care provider if:  You have new symptoms.  Your symptoms get worse or your pain is severe.  You have side effects from your medicines.  You have trouble sleeping.  Your condition is causing depression or anxiety. Summary  Myofascial pain syndrome and fibromyalgia are pain disorders.  Myofascial pain syndrome has tender points in the muscle that will cause pain when pressed (trigger points). Fibromyalgia also has muscle pains and tenderness that come and go, but this condition is often associated with fatigue and sleep disturbances.  Fibromyalgia and myofascial pain syndrome are not the same but often occur together, causing pain and fatigue that make day-to-day activities difficult.  Treatment for fibromyalgia includes taking medicines to relax the muscles and medicines for pain, depression, or seizures. Treatment for myofascial pain syndrome includes taking medicines for pain, cooling and stretching of muscles, and injecting medicines into trigger points.  Follow your health care provider's instructions for taking medicines and maintaining a healthy lifestyle. This information is not intended to replace advice given to you by your health care provider. Make sure you discuss any questions you  have with your health care provider. Document Revised: 05/19/2018 Document Reviewed: 02/09/2017 Elsevier Patient Education  2020 [employer] Patient Education  [employer].

## 2019-02-15 LAB — CBC WITH DIFFERENTIAL/PLATELET
Basophils Absolute: 0.1 10*3/uL (ref 0.0–0.1)
Basophils Relative: 0.8 % (ref 0.0–3.0)
Eosinophils Absolute: 0.1 10*3/uL (ref 0.0–0.7)
Eosinophils Relative: 1 % (ref 0.0–5.0)
HCT: 44.5 % (ref 39.0–52.0)
Hemoglobin: 15.1 g/dL (ref 13.0–17.0)
Lymphocytes Relative: 56.4 % — ABNORMAL HIGH (ref 12.0–46.0)
Lymphs Abs: 5.5 10*3/uL — ABNORMAL HIGH (ref 0.7–4.0)
MCHC: 34 g/dL (ref 30.0–36.0)
MCV: 93.3 fl (ref 78.0–100.0)
Monocytes Absolute: 0.9 10*3/uL (ref 0.1–1.0)
Monocytes Relative: 9.3 % (ref 3.0–12.0)
Neutro Abs: 3.1 10*3/uL (ref 1.4–7.7)
Neutrophils Relative %: 32.5 % — ABNORMAL LOW (ref 43.0–77.0)
Platelets: 229 10*3/uL (ref 150.0–400.0)
RBC: 4.77 Mil/uL (ref 4.22–5.81)
RDW: 13 % (ref 11.5–15.5)
WBC: 9.7 10*3/uL (ref 4.0–10.5)

## 2019-02-15 LAB — SEDIMENTATION RATE: Sed Rate: 3 mm/hr (ref 0–15)

## 2019-02-15 LAB — VITAMIN D 25 HYDROXY (VIT D DEFICIENCY, FRACTURES): VITD: 13.68 ng/mL — ABNORMAL LOW (ref 30.00–100.00)

## 2019-02-15 LAB — COMPREHENSIVE METABOLIC PANEL
ALT: 18 U/L (ref 0–53)
AST: 21 U/L (ref 0–37)
Albumin: 4.8 g/dL (ref 3.5–5.2)
Alkaline Phosphatase: 55 U/L (ref 39–117)
BUN: 10 mg/dL (ref 6–23)
CO2: 30 mEq/L (ref 19–32)
Calcium: 9.8 mg/dL (ref 8.4–10.5)
Chloride: 101 mEq/L (ref 96–112)
Creatinine, Ser: 0.74 mg/dL (ref 0.40–1.50)
GFR: 132.66 mL/min (ref >60.00)
Glucose, Bld: 85 mg/dL (ref 70–99)
Potassium: 4.2 mEq/L (ref 3.5–5.1)
Sodium: 137 mEq/L (ref 135–145)
Total Bilirubin: 0.5 mg/dL (ref 0.2–1.2)
Total Protein: 7.2 g/dL (ref 6.0–8.3)

## 2019-02-15 LAB — LIPID PANEL
Cholesterol: 122 mg/dL (ref 0–200)
HDL: 42.6 mg/dL (ref >39.00)
LDL Cholesterol: 71 mg/dL (ref 0–99)
NonHDL: 79.48
Total CHOL/HDL Ratio: 3
Triglycerides: 43 mg/dL (ref 0.0–149.0)
VLDL: 8.6 mg/dL (ref 0.0–40.0)

## 2019-02-15 LAB — C-REACTIVE PROTEIN: CRP: <1 mg/dL (ref 0.5–20.0)

## 2019-02-15 LAB — TSH: TSH: 1.3 u[IU]/mL (ref 0.35–4.50)

## 2019-02-16 ENCOUNTER — Other Ambulatory Visit: Payer: Self-pay | Admitting: Family Medicine

## 2019-02-16 DIAGNOSIS — E559 Vitamin D deficiency, unspecified: Secondary | ICD-10-CM | POA: Insufficient documentation

## 2019-02-16 LAB — ANA: Anti Nuclear Antibody (ANA): NEGATIVE

## 2019-02-16 LAB — RHEUMATOID FACTOR: Rhuematoid fact SerPl-aCnc: <14 IU/mL (ref <14)

## 2019-02-16 MED ORDER — VITAMIN D (ERGOCALCIFEROL) 1.25 MG (50000 UNIT) PO CAPS: ORAL_CAPSULE | ORAL | 0 refills | Status: DC

## 2019-03-08 ENCOUNTER — Ambulatory Visit (INDEPENDENT_AMBULATORY_CARE_PROVIDER_SITE_OTHER): Payer: Managed Care, Other (non HMO) | Admitting: Family Medicine

## 2019-03-08 ENCOUNTER — Encounter: Payer: Self-pay | Admitting: Family Medicine

## 2019-03-08 VITALS — Ht 70.0 in | Wt 135.0 lb

## 2019-03-08 DIAGNOSIS — M79604 Pain in right leg: Secondary | ICD-10-CM

## 2019-03-08 DIAGNOSIS — M79605 Pain in left leg: Secondary | ICD-10-CM

## 2019-03-08 NOTE — Progress Notes (Signed)
Virtual Visit via Video Note  Subjective  CC:  Chief Complaint  Patient presents with  . Foot Pain    Right foot pain, numbness, and tingling. Sx started yesterday. Felt like shooting pains half way up right thigh. No injury. Pain came on all of a sudden.     I connected with Milon Dikes on 03/08/19 at 11:00 AM EST by a video enabled telemedicine application and verified that I am speaking with the correct person using two identifiers. Location patient: Home Location provider: [facility] [address], Office Persons participating in the virtual visit: MERWYN HODAPP, Willow Ora, MD Gracy Racer, CMA  Same day acute visit; PCP not available. New pt to me. Chart reviewed.   I discussed the limitations of evaluation and management by telemedicine and the availability of in person appointments. The patient expressed understanding and agreed to proceed. HPI: George Pham is a 24 y.o. male who was contacted today to address the problems listed above in the chief complaint. . 24 year old male complains of 24-hour history of right heel pain.  He is a vague historian.  He reports a pressure and stabbing pain in the right heel that came on suddenly yesterday while walking.  Felt like there was a rock in his shoe.  However his examination of his heel and foot is completely normal.  He does admit to mild tenderness at the heel but then also complains of pain and and tingling sensations that are also in his right leg and at times his left leg.  From chart review, he and his PCP are undergoing work-up for other multiple pain complaints.  He denies any red hot swollen joints.  No swelling in the lower extremities.  No injury.  Pain is mild.  He has not taken anything but Tylenol twice to help relieve the symptoms.  Pain seems to come and go.  Not associated with any pattern that he can think of. Assessment  1. Lower extremity pain, bilateral      Plan   Heel and  leg pain, variable: PCP is considering diagnosis of fibromyalgia.  Differential for leg pain also includes plantar fasciitis or other problems.  Patient appears well.  There are no red flags identified.  Low probability for DVT and that its bilateral without swelling.  For now, I recommend Tylenol and/or Advil and follow-up with PCP for further evaluation.  He will monitor for worsening or progression of symptoms.  Patient understands and agrees with care plan. I discussed the assessment and treatment plan with the patient. The patient was provided an opportunity to ask questions and all were answered. The patient agreed with the plan and demonstrated an understanding of the instructions.   The patient was advised to call back or seek an in-person evaluation if the symptoms worsen or if the condition fails to improve as anticipated. Follow up: No follow-ups on file.  04/06/2019  No orders of the defined types were placed in this encounter.     I reviewed the patients updated PMH, FH, and SocHx.    Patient Active Problem List   Diagnosis Date Noted  . Vitamin D deficiency 02/16/2019  . Chronic left-sided low back pain with left-sided sciatica 01/06/2017  . Epigastric pain 01/06/2017  . Attention deficit hyperactivity disorder (ADHD) 06/16/2015  . Bipolar 1 disorder (HCC) 06/16/2015  . Seasonal allergies 06/16/2015   Current Meds  Medication Sig  . ABILIFY 30 MG tablet Take  30 mg by mouth daily.  . busPIRone (BUSPAR) 7.5 MG tablet Take 7.5 mg by mouth 3 (three) times daily.  . clonazePAM (KLONOPIN) 1 MG tablet   . EPINEPHrine (EPI-PEN) 0.3 mg/0.3 mL SOAJ injection Inject into the muscle once.  . fexofenadine (ALLEGRA) 30 MG tablet Take 30 mg by mouth daily.  Marland Kitchen lidocaine (XYLOCAINE) 5 % ointment APPLY TO AFFECTED AREA AS NEEDED  . Vitamin D, Ergocalciferol, (DRISDOL) 1.25 MG (50000 UT) CAPS capsule One capsule by mouth once a week for 12 weeks. Then take 2000IU/day    Allergies: Patient  is allergic to peanuts [peanut oil]. Family History: Patient family history includes Cancer in his father, mother, and another family member; Hyperlipidemia in his father; Hypertension in his mother. Social History:  Patient  reports that he has been smoking cigarettes. He has never used smokeless tobacco. He reports current drug use. Drug: Marijuana. He reports that he does not drink alcohol.  Review of Systems: Constitutional: Negative for fever malaise or anorexia Cardiovascular: negative for chest pain Respiratory: negative for SOB or persistent cough Gastrointestinal: negative for abdominal pain  OBJECTIVE Vitals: Ht 5\' 10"  (1.778 m)   Wt 135 lb (61.2 kg)   BMI 19.37 kg/m  General: no acute distress , A&Ox3 Normal appearing foot, no obvious swelling and video. Leamon Arnt, MD

## 2019-03-21 ENCOUNTER — Encounter: Payer: Self-pay | Admitting: Family Medicine

## 2019-03-22 NOTE — Telephone Encounter (Signed)
Spoke with mother as requested. I made her aware of PHI on file needs to be updated, because of his age. Last one completed was 2017. Message was given below. She voiced understanding.

## 2019-03-22 NOTE — Telephone Encounter (Signed)
Patient's mother is following up on this and wanted to know what they could do to help with his symptoms. Mom would like a returned phone call when someone is available.

## 2019-04-06 ENCOUNTER — Ambulatory Visit: Payer: Managed Care, Other (non HMO) | Admitting: Family Medicine

## 2019-04-06 DIAGNOSIS — Z0289 Encounter for other administrative examinations: Secondary | ICD-10-CM

## 2019-05-04 ENCOUNTER — Ambulatory Visit: Payer: 59 | Attending: Internal Medicine

## 2019-05-04 DIAGNOSIS — Z23 Encounter for immunization: Secondary | ICD-10-CM

## 2019-05-04 NOTE — Progress Notes (Signed)
   Covid-19 Vaccination Clinic  Name:  TOSHIO SLUSHER    MRN: [id number] DOB: 26-Aug-1995  05/04/2019  Mr. Paulsen was observed post Covid-19 immunization for 15 minutes without incident. He was provided with Vaccine Information Sheet and instruction to access the V-Safe system.   Mr. Jake was instructed to call 911 with any severe reactions post vaccine: Marland Kitchen Difficulty breathing  . Swelling of face and throat  . A fast heartbeat  . A bad rash all over body  . Dizziness and weakness   Immunizations Administered    Name Date Dose VIS Date Route   Pfizer COVID-19 Vaccine 05/04/2019 12:23 PM 0.3 mL 01/19/2019 Intramuscular   Manufacturer: [employer], [employer]   Lot: [id number]   NDC: [address]-5

## 2019-05-08 ENCOUNTER — Other Ambulatory Visit: Payer: Self-pay

## 2019-05-08 ENCOUNTER — Telehealth: Payer: Self-pay | Admitting: Family Medicine

## 2019-05-08 ENCOUNTER — Encounter: Payer: Self-pay | Admitting: Physician Assistant

## 2019-05-08 ENCOUNTER — Ambulatory Visit (INDEPENDENT_AMBULATORY_CARE_PROVIDER_SITE_OTHER): Payer: 59

## 2019-05-08 ENCOUNTER — Ambulatory Visit (INDEPENDENT_AMBULATORY_CARE_PROVIDER_SITE_OTHER): Payer: 59 | Admitting: Physician Assistant

## 2019-05-08 VITALS — BP 118/64 | HR 63 | Temp 97.5°F | Ht 70.0 in | Wt 128.0 lb

## 2019-05-08 DIAGNOSIS — M545 Low back pain, unspecified: Secondary | ICD-10-CM

## 2019-05-08 MED ORDER — KETOROLAC TROMETHAMINE 60 MG/2ML IM SOLN
60.0000 mg | Freq: Once | INTRAMUSCULAR | Status: AC
  Administered 2019-05-08: 60 mg via INTRAMUSCULAR

## 2019-05-08 NOTE — Telephone Encounter (Signed)
Chief Complaint Hip pain Reason for Call Symptomatic / Request for Health Information Initial Comment Caller states he fell in the shower. He is having hip and lower back pain. Translation No Nurse Assessment Nurse: Gasper Sells, RN, Marylu Lund Date/Time Lamount Cohen Time): 05/08/2019 11:41:02 AM Confirm and document reason for call. If symptomatic, describe symptoms. ---Caller states he fell in the shower this a.m. He is having hip and lower back pain. Left lower back bruised. Neck pain too while falling back. Limping and swollen area to tailbone area. Using ice. Pain level 9/10 Has the patient had close contact with a person known or suspected to have the novel coronavirus illness OR traveled / lives in area with major community spread (including international travel) in the last 14 days from the onset of symptoms? * If Asymptomatic, screen for exposure and travel within the last 14 days. ---No Does the patient have any new or worsening symptoms? ---Yes Will a triage be completed? ---Yes Related visit to physician within the last 2 weeks? ---No Does the PT have any chronic conditions? (i.e. diabetes, asthma, this includes High risk factors for pregnancy, etc.) ---Yes List chronic conditions. ---fibromyalgia Is this a behavioral health or substance abuse call? ---No Guidelines Guideline Title Affirmed Question Affirmed Notes Nurse Date/Time (Eastern Time) Tailbone Injury [1] SEVERE pain AND [2] not improved 2 hours after pain medicine/ice packs Gasper Sells, RN, Marylu Lund 05/08/2019 11:45:04 AM PLEASE NOTE: All timestamps contained within this report are represented as [place] Standard Time. CONFIDENTIALTY NOTICE: This fax transmission is intended only for the addressee. It contains information that is legally privileged, confidential or otherwise protected from use or disclosure. If you are not the intended recipient, you are strictly prohibited from reviewing, disclosing, copying using or  disseminating any of this information or taking any action in reliance on or regarding this information. If you have received this fax in error, please notify us immediately by telephone so that we can arrange for its return to [place]. Phone: [phone], Toll-Free: [phone], Fax: [phone] Page: 2 of 2 Call Id: [id number] Disp. Time Lamount Cohen Time) Disposition Final User 05/08/2019 11:38:54 AM Attempt made - message left Quentin Cornwall 05/08/2019 11:47:34 AM See HCP within 4 Hours (or PCP triage) Yes Gasper Sells, RN, Herbert Pun Disagree/Comply Comply Caller Understands Yes PreDisposition Call Doctor Care Advice Given Per Guideline SEE HCP WITHIN 4 HOURS (OR PCP TRIAGE): * IF OFFICE WILL BE OPEN: You need to be seen within the next 3 or 4 hours. Call your doctor (or NP/PA) now or as soon as the office opens. PAIN MEDICINES: * IBUPROFEN (E.G., MOTRIN, ADVIL): Take 400 mg (two 200 mg pills) by mouth every 6 hours. The most you should take each day is 1,200 mg (six 200 mg pills), unless your doctor has told you to take more. * For pain relief, you can take either acetaminophen, ibuprofen, or naproxen. USE A COLD PACK FOR PAIN, SWELLING, OR BRUISING: * Put a cold pack or an ice bag (wrapped in a moist towel) on the area for 20 minutes. * Repeat in 1 hour, then as needed. * This will help decrease pain, swelling, and bruising. CALL BACK IF: * You become worse. CARE ADVICE given per Tailbone Injury (Adult) guideline. Comments User: George Coop, RN Date/Time Lamount Cohen Time): 05/08/2019 11:50:30 AM warm tsfrd to backline Referrals Warm transfer to backline

## 2019-05-08 NOTE — Progress Notes (Signed)
George Pham is a 24 y.o. male here for a new problem.  I acted as a Education administrator for [employer], PA-C Anselmo Pickler, LPN  History of Present Illness:   Chief Complaint  Patient presents with  . Fall    HPI   Fall Pt fell in the shower this morning, landed on left lower side. Having pain in left lower back and having sharp pains at times radiating down back of thigh. Tool Tylenol 500 mg with no relief. Has been also applying ice to area. Denies: weakness, bowel/bladder incontinence, hematuria, saddle anesthesia, head trauma, LOC.  Wt Readings from Last 3 Encounters:  05/08/19 128 lb (58.1 kg)  03/08/19 135 lb (61.2 kg)  02/14/19 135 lb 9.6 oz (61.5 kg)     Past Medical History:  Diagnosis Date  . ADHD (attention deficit hyperactivity disorder)   . Allergy   . Bipolar 1 disorder ([place])    Followed by Dr. Ronnald Ramp     Social History   Socioeconomic History  . Marital status: Single    Spouse name: Not on file  . Number of children: Not on file  . Years of education: Not on file  . Highest education level: Not on file  Occupational History  . Occupation: Ship broker  Tobacco Use  . Smoking status: Light Tobacco Smoker    Types: Cigarettes  . Smokeless tobacco: Never Used  Substance and Sexual Activity  . Alcohol use: No    Alcohol/week: 0.0 standard drinks    Comment: rarely/social  . Drug use: Yes    Types: Marijuana  . Sexual activity: Yes  Other Topics Concern  . Not on file  Social History Narrative   Lives with both parents and a brother.   [place].   Social Determinants of Health   Financial Resource Strain:   . Difficulty of Paying Living Expenses:   Food Insecurity:   . Worried About Charity fundraiser in the Last Year:   . Arboriculturist in the Last Year:   Transportation Needs:   . Film/video editor (Medical):   Marland Kitchen Lack of Transportation (Non-Medical):   Physical Activity:   . Days of Exercise per Week:   . Minutes  of Exercise per Session:   Stress:   . Feeling of Stress :   Social Connections:   . Frequency of Communication with Friends and Family:   . Frequency of Social Gatherings with Friends and Family:   . Attends Religious Services:   . Active Member of Clubs or Organizations:   . Attends Archivist Meetings:   Marland Kitchen Marital Status:   Intimate Partner Violence:   . Fear of Current or Ex-Partner:   . Emotionally Abused:   Marland Kitchen Physically Abused:   . Sexually Abused:     Past Surgical History:  Procedure Laterality Date  . TYMPANOSTOMY TUBE PLACEMENT      Family History  Problem Relation Age of Onset  . Hypertension Mother   . Cancer Mother   . Cancer Father   . Hyperlipidemia Father   . Cancer Other     Allergies  Allergen Reactions  . Peanuts [Peanut Oil]     "Ears turn red."     Current Medications:   Current Outpatient Medications:  .  ABILIFY 30 MG tablet, Take 30 mg by mouth daily., Disp: , Rfl:  .  busPIRone (BUSPAR) 7.5 MG tablet, Take 7.5 mg by mouth 3 (three) times daily., Disp: ,  Rfl:  .  clonazePAM (KLONOPIN) 1 MG tablet, Take 1 mg by mouth as needed. , Disp: , Rfl:  .  EPINEPHrine (EPI-PEN) 0.3 mg/0.3 mL SOAJ injection, Inject into the muscle once., Disp: , Rfl:  .  fexofenadine (ALLEGRA) 30 MG tablet, Take 30 mg by mouth daily., Disp: , Rfl:  .  Multiple Vitamin (MULTIVITAMIN) tablet, Take 1 tablet by mouth daily., Disp: , Rfl:  .  omeprazole (PRILOSEC) 20 MG capsule, Take 20 mg by mouth daily., Disp: , Rfl:    Review of Systems:   ROS  Negative unless otherwise specified per HPI.  Vitals:   Vitals:   05/08/19 1533  BP: 118/64  Pulse: 63  Temp: (!) 97.5 F (36.4 C)  TempSrc: Temporal  SpO2: 98%  Weight: 128 lb (58.1 kg)  Height: 5\' 10"  (1.778 m)     Body mass index is 18.37 kg/m.  Physical Exam:   Physical Exam Vitals and nursing note reviewed.  Constitutional:      Appearance: He is well-developed.  HENT:     Head: Normocephalic.   Eyes:     Conjunctiva/sclera: Conjunctivae normal.     Pupils: Pupils are equal, round, and reactive to light.  Pulmonary:     Effort: Pulmonary effort is normal.  Musculoskeletal:        General: Normal range of motion.     Cervical back: Normal range of motion.       Back:  Skin:    General: Skin is warm and dry.  Neurological:     General: No focal deficit present.     Mental Status: He is alert and oriented to person, place, and time.     Coordination: Coordination is intact.     Gait: Gait is intact.  Psychiatric:        Behavior: Behavior normal.        Thought Content: Thought content normal.        Judgment: Judgment normal.       Assessment and Plan:   Dontre was seen today for fall.  Diagnoses and all orders for this visit:  Lumbar pain -     DG Lumbar Spine Complete; Future -     ketorolac (TORADOL) injection 60 mg   Xray pending. Suspect contusion. No red flags on exam. Toradol injection today, tolerated well. Start ibuprofen, may alternate with tylenol, starting tomorrow. Follow-up if pain worsens or does not improve. Worsening precautions advised.  . Reviewed expectations re: course of current medical issues. . Discussed self-management of symptoms. . Outlined signs and symptoms indicating need for more acute intervention. . Patient verbalized understanding and all questions were answered. . See orders for this visit as documented in the electronic medical record. . Patient received an After-Visit Summary.  CMA or LPN served as scribe during this visit. History, Physical, and Plan performed by medical provider. The above documentation has been reviewed and is accurate and complete.   Lyn Hollingshead, PA-C

## 2019-05-08 NOTE — Patient Instructions (Signed)
It was great to see you!  Ice is your friend!  I will be in touch with the xray report.  Ibuprofen and tylenol starting tomorrow.  If pain gets worse -- let me know.  Take care,  Jarold Motto PA-C

## 2019-05-08 NOTE — Telephone Encounter (Signed)
Please schedule appointment with pt.   Thank You. 

## 2019-05-13 ENCOUNTER — Other Ambulatory Visit: Payer: Self-pay | Admitting: Family Medicine

## 2019-05-30 ENCOUNTER — Ambulatory Visit: Payer: 59 | Attending: Internal Medicine

## 2019-05-30 DIAGNOSIS — Z23 Encounter for immunization: Secondary | ICD-10-CM

## 2019-05-30 NOTE — Progress Notes (Signed)
   Covid-19 Vaccination Clinic  Name:  George Pham    MRN: [id number] DOB: 02-Sep-1995  05/30/2019  [name] was observed post Covid-19 immunization for 15 minutes without incident. He was provided with Vaccine Information Sheet and instruction to access the V-Safe system.   [name] was instructed to call 911 with any severe reactions post vaccine: Marland Kitchen Difficulty breathing  . Swelling of face and throat  . A fast heartbeat  . A bad rash all over body  . Dizziness and weakness   Immunizations Administered    Name Date Dose VIS Date Route   Pfizer COVID-19 Vaccine 05/30/2019  1:20 PM 0.3 mL 04/04/2018 Intramuscular   Manufacturer: [employer]   Lot: [id number]   NDC: [address]-0

## 2019-05-30 NOTE — Progress Notes (Signed)
   Covid-19 Vaccination Clinic  Name:  George Pham    MRN: [id number] DOB: 01/08/1996  05/30/2019  [name] was observed post Covid-19 immunization for 15 minutes without incident. He was provided with Vaccine Information Sheet and instruction to access the V-Safe system.   [name] was instructed to call 911 with any severe reactions post vaccine: . Difficulty breathing  . Swelling of face and throat  . A fast heartbeat  . A bad rash all over body  . Dizziness and weakness   Immunizations Administered    Name Date Dose VIS Date Route   Pfizer COVID-19 Vaccine 05/30/2019  1:20 PM 0.3 mL 04/04/2018 Intramuscular   Manufacturer: Pfizer, Inc   Lot: [id number]   NDC: [address]-2     

## 2019-06-19 ENCOUNTER — Encounter: Payer: Self-pay | Admitting: Family Medicine

## 2019-07-01 ENCOUNTER — Other Ambulatory Visit: Payer: Self-pay | Admitting: Family Medicine

## 2019-07-01 DIAGNOSIS — K602 Anal fissure, unspecified: Secondary | ICD-10-CM

## 2019-07-02 NOTE — Telephone Encounter (Signed)
Looks like it was removed. Ok to give new script

## 2019-07-02 NOTE — Telephone Encounter (Signed)
Please review

## 2019-07-02 NOTE — Telephone Encounter (Signed)
Please see if he has another fissure that is needing this..  Dr. Artis Flock

## 2019-07-04 NOTE — Telephone Encounter (Signed)
Spoke to pt's mother she said pt is needing lidocaine ointment due to fissure acting up off and on and was told by GI to continue PRN. Okay to refill?

## 2019-10-01 ENCOUNTER — Other Ambulatory Visit: Payer: Self-pay | Admitting: Family Medicine

## 2019-10-01 DIAGNOSIS — K602 Anal fissure, unspecified: Secondary | ICD-10-CM

## 2019-11-16 ENCOUNTER — Ambulatory Visit (INDEPENDENT_AMBULATORY_CARE_PROVIDER_SITE_OTHER): Payer: 59 | Admitting: Family Medicine

## 2019-11-16 ENCOUNTER — Encounter: Payer: Self-pay | Admitting: Family Medicine

## 2019-11-16 ENCOUNTER — Other Ambulatory Visit: Payer: Self-pay

## 2019-11-16 VITALS — BP 128/76 | HR 81 | Temp 98.4°F | Ht 70.0 in | Wt 141.8 lb

## 2019-11-16 DIAGNOSIS — Z23 Encounter for immunization: Secondary | ICD-10-CM

## 2019-11-16 DIAGNOSIS — M25542 Pain in joints of left hand: Secondary | ICD-10-CM

## 2019-11-16 DIAGNOSIS — M545 Low back pain, unspecified: Secondary | ICD-10-CM

## 2019-11-16 DIAGNOSIS — E559 Vitamin D deficiency, unspecified: Secondary | ICD-10-CM

## 2019-11-16 DIAGNOSIS — M25541 Pain in joints of right hand: Secondary | ICD-10-CM

## 2019-11-16 MED ORDER — GABAPENTIN 300 MG PO CAPS: 300.0000 mg | ORAL_CAPSULE | Freq: Three times a day (TID) | ORAL | 1 refills | Status: DC

## 2019-11-16 NOTE — Patient Instructions (Addendum)
-I still think you have fibromyalgia. I can't give you the #1 drug for this called cybalta due to the other medication you take. So we will try a second agent for this called gabapentin. You can take this up to 3x/day, but start off as needed. We can increase dose if needed, but please let me know first.   Also checking labs to rule out other diseases like rheumatoid, auto immune, ankylosing spondylitis, etc. Fibromyalgia is a clinical diagnosis.   -continue yoga and exercise.   -sometime physical therapy can help as well.   See you back in 3 months, but email me if we need to add on medication.   Good to see you!  Dr. Artis Flock .     Myofascial Pain Syndrome and Fibromyalgia Myofascial pain syndrome and fibromyalgia are both pain disorders. This pain may be felt mainly in your muscles.  Myofascial pain syndrome: ? Always has tender points in the muscle that will cause pain when pressed (trigger points). The pain may come and go. ? Usually affects your neck, upper back, and shoulder areas. The pain often radiates into your arms and hands.  Fibromyalgia: ? Has muscle pains and tenderness that come and go. ? Is often associated with fatigue and sleep problems. ? Has trigger points. ? Tends to be long-lasting (chronic), but is not life-threatening. Fibromyalgia and myofascial pain syndrome are not the same. However, they often occur together. If you have both conditions, each can make the other worse. Both are common and can cause enough pain and fatigue to make day-to-day activities difficult. Both can be hard to diagnose because their symptoms are common in many other conditions. What are the causes? The exact causes of these conditions are not known. What increases the risk? You are more likely to develop this condition if:  You have a family history of the condition.  You have certain triggers, such as: ? Spine disorders. ? An injury (trauma) or other physical stressors. ? Being  under a lot of stress. ? Medical conditions such as osteoarthritis, rheumatoid arthritis, or lupus. What are the signs or symptoms? Fibromyalgia The main symptom of fibromyalgia is widespread pain and tenderness in your muscles. Pain is sometimes described as stabbing, shooting, or burning. You may also have:  Tingling or numbness.  Sleep problems and fatigue.  Problems with attention and concentration (fibro fog). Other symptoms may include:  Bowel and bladder problems.  Headaches.  Visual problems.  Problems with odors and noises.  Depression or mood changes.  Painful menstrual periods (dysmenorrhea).  Dry skin or eyes. These symptoms can vary over time. Myofascial pain syndrome Symptoms of myofascial pain syndrome include:  Tight, ropy bands of muscle.  Uncomfortable sensations in muscle areas. These may include aching, cramping, burning, numbness, tingling, and weakness.  Difficulty moving certain parts of the body freely (poor range of motion). How is this diagnosed? This condition may be diagnosed by your symptoms and medical history. You will also have a physical exam. In general:  Fibromyalgia is diagnosed if you have pain, fatigue, and other symptoms for more than 3 months, and symptoms cannot be explained by another condition.  Myofascial pain syndrome is diagnosed if you have trigger points in your muscles, and those trigger points are tender and cause pain elsewhere in your body (referred pain). How is this treated? Treatment for these conditions depends on the type that you have.  For fibromyalgia: ? Pain medicines, such as NSAIDs. ? Medicines for treating depression. ?  Medicines for treating seizures. ? Medicines that relax the muscles.  For myofascial pain: ? Pain medicines, such as NSAIDs. ? Cooling and stretching of muscles. ? Trigger point injections. ? Sound wave (ultrasound) treatments to stimulate muscles. Treating these conditions often  requires a team of health care providers. These may include:  Your primary care provider.  Physical therapist.  Complementary health care providers, such as massage therapists or acupuncturists.  Psychiatrist for cognitive behavioral therapy. Follow these instructions at home: Medicines  Take over-the-counter and prescription medicines only as told by your health care provider.  Do not drive or use heavy machinery while taking prescription pain medicine.  If you are taking prescription pain medicine, take actions to prevent or treat constipation. Your health care provider may recommend that you: ? Drink enough fluid to keep your urine pale yellow. ? Eat foods that are high in fiber, such as fresh fruits and vegetables, whole grains, and beans. ? Limit foods that are high in fat and processed sugars, such as fried or sweet foods. ? Take an over-the-counter or prescription medicine for constipation. Lifestyle   Exercise as directed by your health care provider or physical therapist.  Practice relaxation techniques to control your stress. You may want to try: ? Biofeedback. ? Visual imagery. ? Hypnosis. ? Muscle relaxation. ? Yoga. ? Meditation.  Maintain a healthy lifestyle. This includes eating a healthy diet and getting enough sleep.  Do not use any products that contain nicotine or tobacco, such as cigarettes and e-cigarettes. If you need help quitting, ask your health care provider. General instructions  Talk to your health care provider about complementary treatments, such as acupuncture or massage.  Consider joining a support group with others who are diagnosed with this condition.  Do not do activities that stress or strain your muscles. This includes repetitive motions and heavy lifting.  Keep all follow-up visits as told by your health care provider. This is important. Where to find more information  National Fibromyalgia Association:  www.fmaware.org  Arthritis Foundation: www.arthritis.org  American Chronic Pain Association: www.theacpa.org Contact a health care provider if:  You have new symptoms.  Your symptoms get worse or your pain is severe.  You have side effects from your medicines.  You have trouble sleeping.  Your condition is causing depression or anxiety. Summary  Myofascial pain syndrome and fibromyalgia are pain disorders.  Myofascial pain syndrome has tender points in the muscle that will cause pain when pressed (trigger points). Fibromyalgia also has muscle pains and tenderness that come and go, but this condition is often associated with fatigue and sleep disturbances.  Fibromyalgia and myofascial pain syndrome are not the same but often occur together, causing pain and fatigue that make day-to-day activities difficult.  Treatment for fibromyalgia includes taking medicines to relax the muscles and medicines for pain, depression, or seizures. Treatment for myofascial pain syndrome includes taking medicines for pain, cooling and stretching of muscles, and injecting medicines into trigger points.  Follow your health care provider's instructions for taking medicines and maintaining a healthy lifestyle. This information is not intended to replace advice given to you by your health care provider. Make sure you discuss any questions you have with your health care provider. Document Revised: 05/19/2018 Document Reviewed: 02/09/2017 Elsevier Patient Education  2020 [employer].

## 2019-11-16 NOTE — Progress Notes (Signed)
Patient: George Pham MRN: [id number] DOB: 1995-03-26 PCP: Orma Flaming, MD     Subjective:  No chief complaint on file.   HPI: The patient is a 24 y.o. male who presents today for joint pain, mainly in his hands. The pain is predominantly in his hands and he states he has tingling in his hands. Pain is stabbing. Pain is all of the time, not worse during any part of the day. Pain rated as a 10/10 in the morning. He doesn't describe it as stiff, but just feel like his hands are breaking. Feels like he has to force his hands to do stuff. He thinks he has some swelling in his joints of his hand. No hypermobile joints. He feels like skin is tight on his hands. He has   He also complains of neck and back pain. This has been going on x 8 years. Has been progressively getting worse, but not has bad as his hands. Lumbar spin in 04/2019 normal. No red flags.    Review of Systems  Constitutional: Negative for fatigue.  Respiratory: Negative for cough and shortness of breath.   Musculoskeletal: Positive for back pain, joint swelling and neck pain.  Neurological: Negative for dizziness, weakness, light-headedness, numbness and headaches.    Allergies Patient is allergic to peanuts [peanut oil].  Past Medical History Patient  has a past medical history of ADHD (attention deficit hyperactivity disorder), Allergy, and Bipolar 1 disorder ([place]).  Surgical History Patient  has a past surgical history that includes Tympanostomy tube placement.  Family History Pateint's family history includes Cancer in his father, mother, and another family member; Hyperlipidemia in his father; Hypertension in his mother.  Social History Patient  reports that he has been smoking cigarettes. He has never used smokeless tobacco. He reports current drug use. Drug: Marijuana. He reports that he does not drink alcohol.    Objective: Vitals:   11/16/19 1306  BP: 128/76  Pulse: 81  Temp: 98.4 F (36.9 C)   TempSrc: Temporal  SpO2: 100%  Weight: 141 lb 12.8 oz (64.3 kg)  Height: '5\' 10"'  (1.778 m)    Body mass index is 20.35 kg/m.  Physical Exam Vitals reviewed.  Constitutional:      General: He is not in acute distress.    Appearance: Normal appearance. He is normal weight. He is not ill-appearing.  HENT:     Head: Normocephalic and atraumatic.  Pulmonary:     Effort: Pulmonary effort is normal.  Abdominal:     General: Abdomen is flat.     Palpations: Abdomen is soft.  Musculoskeletal:        General: Tenderness present. No swelling. Normal range of motion.     Cervical back: Normal range of motion and neck supple. No rigidity.     Right lower leg: No edema.     Left lower leg: No edema.     Comments: He has TTP at all fibromyalgia points.  No edema or erythema or joint abnormality. He is TTP over all joints of his hand bilaterally. Full ROM. Hand grip intact.  No joint subluxation or double joints.   Skin:    General: Skin is warm.     Capillary Refill: Capillary refill takes less than 2 seconds.     Findings: No rash.  Neurological:     General: No focal deficit present.     Mental Status: He is alert and oriented to person, place, and time.  Assessment/plan:  1. Arthralgia of hands, bilateral Have checked for RA/ANA in the past, but will check labs again today with inflammatory markers.  - CBC with Differential/Platelet; Future - ANA, IFA Comprehensive Panel; Future - Rheumatoid factor; Future - Cyclic citrul peptide antibody, IgG; Future - C-reactive protein; Future - Sedimentation rate; Future  3. Acute bilateral low back pain without sciatica Rule out AS, lumbar xray wnl. Discussed I do think this is more fibromyalgia, but this is a clinical diagnosis. Will start trial of gabapentin and discussed how to use and side effects for fibro treatment. Can not do cymbalta due to other depression/bi polar drugs. Encouraged yoga and exercise and to keep moving. F/u  with me in 3 months time.  - HLA-B27 antigen; Future  4. Vitamin D deficiency  - VITAMIN D 25 Hydroxy (Vit-D Deficiency, Fractures); Future  5. Flu shot today   This visit occurred during the SARS-CoV-2 public health emergency.  Safety protocols were in place, including screening questions prior to the visit, additional usage of staff PPE, and extensive cleaning of exam room while observing appropriate contact time as indicated for disinfecting solutions.     Return in about 3 months (around 02/16/2020) for joint pain/medication check up .    Orma Flaming, MD [place]   11/16/2019

## 2019-11-19 ENCOUNTER — Other Ambulatory Visit: Payer: Self-pay | Admitting: Family Medicine

## 2019-11-19 MED ORDER — VITAMIN D (ERGOCALCIFEROL) 1.25 MG (50000 UNIT) PO CAPS: ORAL_CAPSULE | ORAL | 0 refills | Status: DC

## 2019-11-20 ENCOUNTER — Other Ambulatory Visit: Payer: Self-pay | Admitting: Family Medicine

## 2019-11-20 DIAGNOSIS — K602 Anal fissure, unspecified: Secondary | ICD-10-CM

## 2019-11-21 LAB — ANA, IFA COMPREHENSIVE PANEL
Anti Nuclear Antibody (ANA): NEGATIVE
ENA SM Ab Ser-aCnc: <1 AI
SM/RNP: <1 AI
SSA (Ro) (ENA) Antibody, IgG: <1 AI
SSB (La) (ENA) Antibody, IgG: <1 AI
Scleroderma (Scl-70) (ENA) Antibody, IgG: <1 AI
ds DNA Ab: <1 IU/mL

## 2019-11-21 LAB — CBC WITH DIFFERENTIAL/PLATELET
Absolute Monocytes: 623 cells/uL (ref 200–950)
Basophils Absolute: 93 cells/uL (ref 0–200)
Basophils Relative: 1 %
Eosinophils Absolute: 530 cells/uL — ABNORMAL HIGH (ref 15–500)
Eosinophils Relative: 5.7 %
HCT: 44 % (ref 38.5–50.0)
Hemoglobin: 14.9 g/dL (ref 13.2–17.1)
Lymphs Abs: 2716 cells/uL (ref 850–3900)
MCH: 32 pg (ref 27.0–33.0)
MCHC: 33.9 g/dL (ref 32.0–36.0)
MCV: 94.4 fL (ref 80.0–100.0)
MPV: 9.7 fL (ref 7.5–12.5)
Monocytes Relative: 6.7 %
Neutro Abs: 5338 cells/uL (ref 1500–7800)
Neutrophils Relative %: 57.4 %
Platelets: 280 10*3/uL (ref 140–400)
RBC: 4.66 10*6/uL (ref 4.20–5.80)
RDW: 11.8 % (ref 11.0–15.0)
Total Lymphocyte: 29.2 %
WBC: 9.3 10*3/uL (ref 3.8–10.8)

## 2019-11-21 LAB — C-REACTIVE PROTEIN: CRP: 1.8 mg/L (ref <8.0)

## 2019-11-21 LAB — SEDIMENTATION RATE: Sed Rate: 2 mm/h (ref 0–15)

## 2019-11-21 LAB — RHEUMATOID FACTOR: Rheumatoid fact SerPl-aCnc: <14 IU/mL (ref <14)

## 2019-11-21 LAB — CYCLIC CITRUL PEPTIDE ANTIBODY, IGG: Cyclic Citrullin Peptide Ab: <16 UNITS

## 2019-11-21 LAB — VITAMIN D 25 HYDROXY (VIT D DEFICIENCY, FRACTURES): Vit D, 25-Hydroxy: 22 ng/mL — ABNORMAL LOW (ref 30–100)

## 2019-11-21 LAB — HLA-B27 ANTIGEN: HLA-B27 Antigen: NEGATIVE

## 2019-12-10 ENCOUNTER — Other Ambulatory Visit: Payer: Self-pay | Admitting: Family Medicine

## 2020-01-01 ENCOUNTER — Other Ambulatory Visit: Payer: Self-pay | Admitting: Family Medicine

## 2020-01-15 ENCOUNTER — Other Ambulatory Visit: Payer: Self-pay | Admitting: Family Medicine

## 2020-01-26 ENCOUNTER — Other Ambulatory Visit: Payer: Self-pay | Admitting: Family Medicine

## 2020-02-08 ENCOUNTER — Other Ambulatory Visit: Payer: Self-pay | Admitting: Family Medicine

## 2020-02-08 DIAGNOSIS — K602 Anal fissure, unspecified: Secondary | ICD-10-CM

## 2020-02-18 ENCOUNTER — Ambulatory Visit: Payer: 59 | Admitting: Family Medicine

## 2020-02-18 DIAGNOSIS — Z0289 Encounter for other administrative examinations: Secondary | ICD-10-CM

## 2020-02-21 ENCOUNTER — Other Ambulatory Visit: Payer: Self-pay | Admitting: Family Medicine

## 2020-03-12 ENCOUNTER — Other Ambulatory Visit: Payer: Self-pay | Admitting: Family Medicine

## 2020-03-12 DIAGNOSIS — K602 Anal fissure, unspecified: Secondary | ICD-10-CM

## 2020-03-24 ENCOUNTER — Other Ambulatory Visit: Payer: Self-pay | Admitting: Family Medicine

## 2020-04-15 ENCOUNTER — Other Ambulatory Visit: Payer: Self-pay | Admitting: Family Medicine

## 2020-04-17 ENCOUNTER — Encounter: Payer: Self-pay | Admitting: Family Medicine

## 2020-04-24 ENCOUNTER — Encounter: Payer: Self-pay | Admitting: Family Medicine

## 2020-04-24 ENCOUNTER — Ambulatory Visit (INDEPENDENT_AMBULATORY_CARE_PROVIDER_SITE_OTHER): Payer: 59 | Admitting: Family Medicine

## 2020-04-24 ENCOUNTER — Other Ambulatory Visit: Payer: Self-pay

## 2020-04-24 VITALS — BP 112/70 | HR 84 | Temp 98.3°F | Ht 70.0 in | Wt 146.6 lb

## 2020-04-24 DIAGNOSIS — M722 Plantar fascial fibromatosis: Secondary | ICD-10-CM

## 2020-04-24 DIAGNOSIS — S86899A Other injury of other muscle(s) and tendon(s) at lower leg level, unspecified leg, initial encounter: Secondary | ICD-10-CM

## 2020-04-24 DIAGNOSIS — M2550 Pain in unspecified joint: Secondary | ICD-10-CM | POA: Diagnosis not present

## 2020-04-24 MED ORDER — METHOCARBAMOL 500 MG PO TABS: 500.0000 mg | ORAL_TABLET | Freq: Three times a day (TID) | ORAL | 1 refills | Status: DC | PRN

## 2020-04-24 NOTE — Progress Notes (Signed)
Patient: George Pham MRN: [id number] DOB: 1995-05-11 PCP: Orma Flaming, MD     Subjective:  Chief Complaint  Patient presents with  . Joint Pain  . Foot Pain  . shin pain    HPI: The patient is a 25 y.o. male who presents today for all over worsening joint pain, shin pain and foot pain. He says that his feet and knees have worsened.   He has pain everywhere. He states pain started when he was 25 years of age. He doesn't have joint swelling. He states his shins feel like they are doing a weird thing (bone popping out sensation). His knees and feet hurt. He finds himself pushing on the ground when he is sitting down. I have done a big work up on him with negative labs and xrays. I put him on gabapentin and he does think this helps. He also feels like a bone is popping out sensation in his hands.  He states his knees, neck, feet (toes) seem to be the worse.   (cbc, cmp, ESR, CRP, CCP, ANA, BLA-B27 all negative).   He has been exercising with yoga and jogging.  He is doing well except the joint pain. He has even gained weight and eating better.    Review of Systems  Constitutional: Negative for fever.  HENT: Negative for congestion.   Respiratory: Negative for cough and shortness of breath.   Cardiovascular: Negative for chest pain, palpitations and leg swelling.  Gastrointestinal: Negative for abdominal pain, diarrhea and nausea.  Musculoskeletal: Positive for arthralgias and myalgias. Negative for gait problem, joint swelling, neck pain and neck stiffness.    Allergies Patient is allergic to peanuts [peanut oil].  Past Medical History Patient  has a past medical history of ADHD (attention deficit hyperactivity disorder), Allergy, and Bipolar 1 disorder ([place]).  Surgical History Patient  has a past surgical history that includes Tympanostomy tube placement.  Family History Pateint's family history includes Cancer in his father, mother, and another family member;  Hyperlipidemia in his father; Hypertension in his mother.  Social History Patient  reports that he has been smoking cigarettes. He has never used smokeless tobacco. He reports current drug use. Drug: Marijuana. He reports that he does not drink alcohol.    Objective: Vitals:   04/24/20 1403  BP: 112/70  Pulse: 84  Temp: 98.3 F (36.8 C)  TempSrc: Temporal  SpO2: 98%  Weight: 146 lb 9.6 oz (66.5 kg)  Height: _0  (1.778 m)    Body mass index is 21.03 kg/m.  Physical Exam Vitals reviewed.  Constitutional:      General: He is not in acute distress.    Appearance: Normal appearance. He is normal weight. He is not ill-appearing.  HENT:     Head: Normocephalic and atraumatic.  Cardiovascular:     Pulses: Normal pulses.  Pulmonary:     Effort: Pulmonary effort is normal.  Abdominal:     Palpations: Abdomen is soft.  Musculoskeletal:        General: Tenderness (anterior bilateral shins. he also has pain in his plantar fascia with dorsiflexion and palpation. hypermobilitiy of finger joints. ) present. No swelling or deformity.     Cervical back: Normal range of motion and neck supple.     Right lower leg: No edema.     Left lower leg: No edema.  Skin:    Comments: Soft/velvet skin   Neurological:     General: No focal deficit present.     Mental  Status: He is alert and oriented to person, place, and time.     Cranial Nerves: No cranial nerve deficit.     Sensory: No sensory deficit.     Motor: No weakness.     Gait: Gait normal.     Deep Tendon Reflexes: Reflexes normal.  Psychiatric:        Mood and Affect: Mood normal.        Assessment/plan: 1. Arthralgia, unspecified joint He has some hypermobility and skin findings. Will send to sports med to make sure no ehlers/danlos or marfans that could be contributing to his pain. No other significant findings on exam or lab work up. Gabapentin has helped and will trial muscle relaxer. Have discussed fibromyalgia in the  past. Continue exercise/yoga.  - Ambulatory referral to Sports Medicine  2. Plantar fasciitis, bilateral -exercises given to start doing daily -voltaren gel tid -good, supportive shoes with arch support -can discuss with sports med as well.   3. Anterior shin splints Handout given on exercises to start doing -recommended better shoes as well and can do voltaren gel prn.     This visit occurred during the SARS-CoV-2 public health emergency.  Safety protocols were in place, including screening questions prior to the visit, additional usage of staff PPE, and extensive cleaning of exam room while observing appropriate contact time as indicated for disinfecting solutions.     Return if symptoms worsen or fail to improve.   Orma Flaming, MD [place]   04/24/2020

## 2020-04-24 NOTE — Patient Instructions (Signed)
1) plantar fascitis for your feet. Handout of exercises and get over the counter voltaren gel and can put on bottom of feet 3x/day.  I also like freezing water bottle and rolling bottom of feet  2) shint splints  -can put voltaren on this area as well. See exercises that I gave as well and can follow up with sports medicine.   3) for joint pain -sending you to sports medicine to rule out a hypermobility syndrome causing pain like ehlers danlos or marfans.   4) will be in touch!   Dr. Artis Flock

## 2020-05-01 ENCOUNTER — Encounter: Payer: Self-pay | Admitting: Family Medicine
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
## 2020-05-14 ENCOUNTER — Ambulatory Visit (INDEPENDENT_AMBULATORY_CARE_PROVIDER_SITE_OTHER): Payer: 59

## 2020-05-14 ENCOUNTER — Encounter: Payer: Self-pay | Admitting: Family Medicine

## 2020-05-14 ENCOUNTER — Ambulatory Visit: Payer: Self-pay

## 2020-05-14 ENCOUNTER — Ambulatory Visit (INDEPENDENT_AMBULATORY_CARE_PROVIDER_SITE_OTHER): Payer: 59 | Admitting: Family Medicine

## 2020-05-14 ENCOUNTER — Other Ambulatory Visit: Payer: Self-pay

## 2020-05-14 VITALS — BP 100/60 | HR 93 | Ht 70.0 in | Wt 141.0 lb

## 2020-05-14 DIAGNOSIS — M5416 Radiculopathy, lumbar region: Secondary | ICD-10-CM

## 2020-05-14 DIAGNOSIS — M25512 Pain in left shoulder: Secondary | ICD-10-CM | POA: Diagnosis not present

## 2020-05-14 DIAGNOSIS — M79672 Pain in left foot: Secondary | ICD-10-CM | POA: Diagnosis not present

## 2020-05-14 DIAGNOSIS — G8929 Other chronic pain: Secondary | ICD-10-CM | POA: Diagnosis not present

## 2020-05-14 DIAGNOSIS — M79671 Pain in right foot: Secondary | ICD-10-CM | POA: Diagnosis not present

## 2020-05-14 NOTE — Patient Instructions (Addendum)
Good to see you   Please get an Xray today before you leave today.  See me again 1 month.   Research Cymbalta and Lyrica.    Please complete the exercises that the athletic trainer went over with you: View at [email or web address] using code: [id number]

## 2020-05-15 NOTE — Progress Notes (Signed)
Left shoulder x-ray looks normal to radiology

## 2020-05-15 NOTE — Progress Notes (Signed)
X-ray lumbar spine looks normal to radiology

## 2020-05-20 ENCOUNTER — Other Ambulatory Visit: Payer: Self-pay

## 2020-05-20 ENCOUNTER — Encounter: Payer: Self-pay | Admitting: Physical Therapy

## 2020-05-20 ENCOUNTER — Telehealth: Payer: Self-pay | Admitting: Family Medicine

## 2020-05-20 ENCOUNTER — Ambulatory Visit: Payer: 59 | Admitting: Physical Therapy

## 2020-05-20 ENCOUNTER — Ambulatory Visit (INDEPENDENT_AMBULATORY_CARE_PROVIDER_SITE_OTHER): Payer: 59 | Admitting: Physical Therapy

## 2020-05-20 DIAGNOSIS — M6281 Muscle weakness (generalized): Secondary | ICD-10-CM

## 2020-05-20 DIAGNOSIS — R262 Difficulty in walking, not elsewhere classified: Secondary | ICD-10-CM

## 2020-05-20 DIAGNOSIS — M25561 Pain in right knee: Secondary | ICD-10-CM | POA: Diagnosis not present

## 2020-05-20 DIAGNOSIS — M545 Low back pain, unspecified: Secondary | ICD-10-CM

## 2020-05-20 DIAGNOSIS — M25562 Pain in left knee: Secondary | ICD-10-CM

## 2020-05-20 NOTE — Patient Instructions (Signed)
Schedule visit with PCP at your soonest availability in order to perform some more diagnostic testing. PT does not seem appropriate at this time. If cleared by PCP, then please schedule follow up therapy visits.

## 2020-05-20 NOTE — Telephone Encounter (Signed)
Spoke with Hessie Diener PT.  Pt should cancel appt scheduled with PA at [facility] on the 14th and schedule with me this or next week. This is not super urgent but should be done soonish.   Concern more lumbar radicular symptoms.

## 2020-05-20 NOTE — Therapy (Addendum)
[facility] [address] [place], [place], [address] Phone: [phone]   Fax:  [phone]  Physical Therapy Evaluation/Discharge  Patient Details  Name: George Pham MRN: [id number] Date of Birth: 1995-02-21 Referring Provider (PT): Dr. Georgina Snell   Encounter Date: 05/20/2020   PT End of Session - 05/20/20 1342     Visit Number 1    Number of Visits 17    Date for PT Re-Evaluation 07/19/20    Authorization Type Aetna/Aetna [place]    PT Start Time 1215    PT Stop Time 1315    PT Time Calculation (min) 60 min    Activity Tolerance Patient tolerated treatment well    Behavior During Therapy [facility] for tasks assessed/performed             Past Medical History:  Diagnosis Date   ADHD (attention deficit hyperactivity disorder)    Allergy    Bipolar 1 disorder ([place])    Followed by Dr. Ronnald Ramp    Past Surgical History:  Procedure Laterality Date   TYMPANOSTOMY TUBE PLACEMENT      There were no vitals filed for this visit.    Subjective Assessment - 05/20/20 1221     Subjective Pt states he is coming to PT today for mostly his back and LE. He reports this has been going on for quite some time. Pt states the L shoulder also causes him to not be able to sleep at night. Pt states he has fibromyalgia as well as restless leg syndrome. He feels like he is "being crushed by gravity" all the time and has constant pain and soreness. He feels that his pelvis/back bone have been "poking out and moving" recently and have been giving him pain. Marland Kitchen He feels like his knees buckle and crack. He states that that may be be from previous history of physical labor. He reports that the L LE and side of his body are more affected. He states he feels a shooting pain from behind the knee that goes down his calf and shin and wraps around the bottom of his foot. He feels NT into bilateral LE and into feet and toes when he flexes his digits. He states that stepping  wrong or stepping at a wrong angle will cause shooting numbess. Pt states he has been doing some yoga/exercise that has helped. Pain: Aggs: coughing, lifting/pushing, walking with feet forward, hills, stairs, stepping on small things under his foot; Eases: medications, sitting/aying in a recliner. Worst 8/10 Best 0/10, Current 2/10. Pt denies increased night pain as it pertains to lower back and LE. Pt does report signficant weight flucuations within the last 2 years as well as loss of appetite. Pt endorses feelings of signifiant bilateral LE weakness after 20 mins of standing/walking that forces him to sit down. He does not report full drop attack. Pt endorses BB changes within the last 3-4 months. Pt states that while sitting, coughing will cause urinary incontinence. Pt denies saddle anesthesia. Pt denies recent fever, nausea, vomitting.    Limitations Sitting;Lifting;Standing;Walking    How long can you sit comfortably? 20 mins    How long can you stand comfortably? 20 mins    How long can you walk comfortably? 20 mins    Diagnostic tests X-ray of L shoulder and L/S: Negative for both    Patient Stated Goals Pt states he wants to get back into shape and work out again.    Currently in Pain? Yes  Pain Score 2     Pain Location Back    Pain Orientation Lower    Pain Descriptors / Indicators Aching;Shooting;Sharp;Tingling    Pain Radiating Towards feet and ankles    Multiple Pain Sites Yes    Pain Score 2    Pain Location Ankle    Pain Orientation Right    Pain Descriptors / Indicators Aching;Shooting;Tingling;Sore                [facility] PT Assessment - 05/20/20 0001       Assessment   Medical Diagnosis bilateral foot pain; lumbar radiculopathy, chronic L shoulder pain    Referring Provider (PT) Dr. 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     Cognition   Overall Cognitive Status Within Functional Limits for tasks assessed      Observation/Other Assessments   Other Surveys  Lower Extremity Functional Scale;Oswestry Disability Index      Sensation   Light Touch Impaired by gross assessment    Additional Comments paresthesia along L4-5 dermatome      Functional Tests   Functional tests Squat;Step up;Step down      Squat   Comments decreased depth, significant toe out, quad dominant, pain into knees      Step Up   Comments pain with R ascending      Step Down   Comments decreased eccentric control bilaterally, excessive pronation, dynamic valgus      Posture/Postural Control   Posture/Postural Control Postural limitations    Postural Limitations Decreased lumbar lordosis;Posterior pelvic tilt      ROM / Strength   AROM / PROM / Strength AROM;PROM;Strength      AROM   Overall AROM Comments bilat hip WFL, L/S WFL- increased pain and distal radiating sx with end range extension and rotation      PROM   Overall PROM Comments WFL bilat hips      Strength   Overall Strength Comments bilat LE strength 4/5, tetany with resistance, no recreation of pain      Flexibility   Soft Tissue Assessment /Muscle Length yes    Hamstrings (+) supine 90/90    Quadriceps (+) Ely's      Palpation   Spinal mobility decreased extension and UPA L2-5    SI assessment  L ant innominate rotation    Palpation comment L3-5 midline tenderness, increased tenderness across L iliac crest and posterior aspect of ilium      Special Tests    Special Tests Lumbar;Sacrolliac Tests    Lumbar Tests FABER test;Straight Leg Raise      FABER test   findings Positive    Side Right      Straight Leg Raise   Findings Positive    Comment bilat      Ambulation/Gait   Gait Pattern  Decreased step length - right;Decreased step length - left;Decreased dorsiflexion - right;Decreased dorsiflexion - left    Stairs Yes    Stair Management Technique Alternating pattern;One rail Right  Objective measurements completed on examination: See above findings.               PT Education - 05/20/20 1342     Education Details MOI, diagnosis, prognosis, anatomy, exercise progression, exam findings, muscle firing, POC, red flag concerns    Person(s) Educated Patient    Methods Explanation;Demonstration;Tactile cues;Verbal cues    Comprehension Verbalized understanding;Returned demonstration;Verbal cues required                         Plan - 05/20/20 1354     Clinical Impression Statement Pt is a 25 y.o. presenting to PT eval today with CC of LE, back and L shoulder pain. Evaluation was focused on L/S and LE due to increased difficulty with function and influence on daily life. Pt presents with radiating LE pain with functional movement screening, L/S midline tenderness, paresthesia, and LE weakness.  Due to pt report of relatively recent red flag sx as well as increased distal sx with movement screen, pt was referred back to PCP for more imaging/diagnostic testing. Pt's s/s appear to suggest nerve root compression or possible cauda equina compression. Does not appear to be medical emergency. Pt gave verbal understanding to exam results and PT request for medical follow up sooner rather than later. If cleared for PT, pt was given edu about continuing with skilled therapy services. Pt was scheduled for PCP visit within the next two days.    Personal Factors and Comorbidities Time since onset of injury/illness/exacerbation;Fitness;Behavior Pattern    Examination-Activity Limitations Lift;Stand;Transfers;Sit;Squat;Stairs;Locomotion Level    Examination-Participation Restrictions Occupation;Other    Stability/Clinical Decision  Making Unstable/Unpredictable    Clinical Decision Making Moderate    Rehab Potential Fair    PT Frequency 2x / week   If cleared for PT, pt to continue therapy.   PT Duration 8 weeks    PT Treatment/Interventions ADLs/Self Care Home Management;Aquatic Therapy;Electrical Stimulation;Iontophoresis 12m/ml Dexamethasone;Moist Heat;Traction;Ultrasound;Therapeutic activities;Therapeutic exercise;Functional mobility training;Gait training;Balance training;Neuromuscular re-education;Patient/family education;Orthotic Fit/Training;Passive range of motion;Dry needling;Taping;Compression bandaging;Manual techniques;Spinal Manipulations;Joint Manipulations    Consulted and Agree with Plan of Care Patient             Patient will benefit from skilled therapeutic intervention in order to improve the following deficits and impairments:  Pain,Difficulty walking,Decreased strength,Decreased endurance,Decreased activity tolerance,Hypomobility,Increased muscle spasms  Visit Diagnosis: Muscle weakness (generalized)  Pain, lumbar region  Difficulty walking  Pain in joint of right knee  Pain in joint of left knee     Problem List Patient Active Problem List   Diagnosis Date Noted   Vitamin D deficiency 02/16/2019   Chronic left-sided low back pain with left-sided sciatica 01/06/2017   Epigastric pain 01/06/2017   Attention deficit hyperactivity disorder (ADHD) 06/16/2015   Bipolar 1 disorder ([place] 06/16/2015   Seasonal allergies 06/16/2015   ADaleen BoPT, DPT 05/20/20 2:56 PM   [facility] [address]Phone: [phone]  Fax:  [phone] Name: George BERRYMRN: [id number] of Birth: [birth year]  PHYSICAL THERAPY DISCHARGE SUMMARY  Visits from Start of Care: 1   Plan: Patient goals were not met. Patient is being discharged due to not returning to PT.

## 2020-05-20 NOTE — Telephone Encounter (Signed)
Pt is scheduled for a f/u visit for 4/14.

## 2020-05-21 ENCOUNTER — Other Ambulatory Visit: Payer: Self-pay | Admitting: Family Medicine

## 2020-05-21 DIAGNOSIS — K602 Anal fissure, unspecified: Secondary | ICD-10-CM

## 2020-05-21 NOTE — Progress Notes (Deleted)
I, Peterson Lombard, LAT, ATC acting as a scribe for Lynne Leader, MD.  George Pham is a 25 y.o. male who presents to [place] [facility] today for lumbar radiculopathy. At pt's first PT visit, he reported experiencing urinary incontinence upon coughing while sitting. Pt was last seen by Dr. Georgina Snell on 05/14/20 and was taughtreferred to PT of which he's completed 1 visit. Pt was also taught HEP and provided names for 2 potential meds to treat fibromyalgia. Today, pt reports   Dx testing: 05/08/19 L-spine XR             11/16/19 labs (cbc, cmp, ESR, CRP, CCP, ANA, BLA-B27 - all negative)             08/10/18 L knee & L shoulder XR             06/16/15 R ankle & R foot XR  Pertinent review of systems: ***  Relevant historical information: ***   Exam:  There were no vitals taken for this visit. General: Well Developed, well nourished, and in no acute distress.   MSK: ***    Lab and Radiology Results No results found for this or any previous visit (from the past 72 hour(s)). No results found.     Assessment and Plan: 25 y.o. male with ***   PDMP not reviewed this encounter. No orders of the defined types were placed in this encounter.  No orders of the defined types were placed in this encounter.    Discussed warning signs or symptoms. Please see discharge instructions. Patient expresses understanding.   ***

## 2020-05-22 ENCOUNTER — Ambulatory Visit: Payer: 59 | Admitting: Family Medicine

## 2020-05-22 ENCOUNTER — Ambulatory Visit: Payer: 59 | Admitting: Physician Assistant

## 2020-05-28 ENCOUNTER — Encounter: Payer: Self-pay | Admitting: Family Medicine

## 2020-05-29 MED ORDER — CETIRIZINE HCL 10 MG PO TABS: 10.0000 mg | ORAL_TABLET | Freq: Every day | ORAL | 11 refills | Status: DC

## 2020-06-07 ENCOUNTER — Other Ambulatory Visit: Payer: Self-pay | Admitting: Family Medicine

## 2020-06-07 DIAGNOSIS — K602 Anal fissure, unspecified: Secondary | ICD-10-CM

## 2020-06-12 ENCOUNTER — Ambulatory Visit: Payer: 59 | Admitting: Family Medicine

## 2020-07-04 ENCOUNTER — Other Ambulatory Visit: Payer: Self-pay | Admitting: Family Medicine

## 2020-07-10 ENCOUNTER — Ambulatory Visit: Payer: 59 | Admitting: Family Medicine

## 2020-07-10 NOTE — Progress Notes (Deleted)
I, Peterson Lombard, LAT, ATC acting as a scribe for Lynne Leader, MD.  George Pham is a 25 y.o. male who presents to [place] [facility] today for multifactorial pain; bilat foot, L shoulder, back, anterior lower leg pains. Pt was last seen by Dr. Georgina Snell on 05/14/20 and was referred to PT of which he's completed 1 visit. Pt was also provided the names of rx that may be helpful in treating his pain; Cymbalta and Lyrica. Today, pt reports  Dx testing: 05/14/20 L shoulder & L-spine XR  05/08/19 L-spine XR             11/16/19 labs (cbc, cmp, ESR, CRP, CCP, ANA, BLA-B27)             08/10/18 L knee & L shoulder XR             06/16/15 R ankle & R foot XR  Pertinent review of systems: ***  Relevant historical information: ***   Exam:  There were no vitals taken for this visit. General: Well Developed, well nourished, and in no acute distress.   MSK: ***    Lab and Radiology Results No results found for this or any previous visit (from the past 72 hour(s)). No results found.     Assessment and Plan: 25 y.o. male with ***   PDMP not reviewed this encounter. No orders of the defined types were placed in this encounter.  No orders of the defined types were placed in this encounter.    Discussed warning signs or symptoms. Please see discharge instructions. Patient expresses understanding.   ***

## 2020-08-09 ENCOUNTER — Encounter: Payer: Self-pay | Admitting: Family Medicine

## 2020-08-31 ENCOUNTER — Other Ambulatory Visit: Payer: Self-pay | Admitting: Family Medicine

## 2020-09-01 ENCOUNTER — Other Ambulatory Visit: Payer: Self-pay

## 2020-10-26 ENCOUNTER — Other Ambulatory Visit: Payer: Self-pay | Admitting: Family Medicine

## 2020-12-04 ENCOUNTER — Other Ambulatory Visit: Payer: Self-pay | Admitting: Family Medicine

## 2020-12-15 ENCOUNTER — Encounter: Payer: 59 | Admitting: Family

## 2020-12-25 ENCOUNTER — Encounter: Payer: 59 | Admitting: Physician Assistant

## 2020-12-26 ENCOUNTER — Encounter: Payer: Self-pay | Admitting: Physician Assistant

## 2021-02-04 ENCOUNTER — Other Ambulatory Visit: Payer: Self-pay | Admitting: Family

## 2021-05-19 ENCOUNTER — Encounter: Payer: Self-pay | Admitting: Family Medicine

## 2021-05-19 ENCOUNTER — Ambulatory Visit (INDEPENDENT_AMBULATORY_CARE_PROVIDER_SITE_OTHER): Payer: BC Managed Care – PPO | Admitting: Family Medicine

## 2021-05-19 VITALS — BP 108/60 | HR 70 | Temp 98.0°F | Ht 70.0 in | Wt 145.6 lb

## 2021-05-19 DIAGNOSIS — J301 Allergic rhinitis due to pollen: Secondary | ICD-10-CM | POA: Diagnosis not present

## 2021-05-19 DIAGNOSIS — E559 Vitamin D deficiency, unspecified: Secondary | ICD-10-CM | POA: Diagnosis not present

## 2021-05-19 DIAGNOSIS — M797 Fibromyalgia: Secondary | ICD-10-CM | POA: Diagnosis not present

## 2021-05-19 DIAGNOSIS — G4489 Other headache syndrome: Secondary | ICD-10-CM

## 2021-05-19 LAB — VITAMIN D 25 HYDROXY (VIT D DEFICIENCY, FRACTURES): VITD: 18.56 ng/mL — ABNORMAL LOW (ref 30.00–100.00)

## 2021-05-19 LAB — COMPREHENSIVE METABOLIC PANEL
ALT: 33 U/L (ref 0–53)
AST: 31 U/L (ref 0–37)
Albumin: 4.8 g/dL (ref 3.5–5.2)
Alkaline Phosphatase: 48 U/L (ref 39–117)
BUN: 13 mg/dL (ref 6–23)
CO2: 29 mEq/L (ref 19–32)
Calcium: 10 mg/dL (ref 8.4–10.5)
Chloride: 102 mEq/L (ref 96–112)
Creatinine, Ser: 0.69 mg/dL (ref 0.40–1.50)
GFR: 130.05 mL/min (ref >60.00)
Glucose, Bld: 88 mg/dL (ref 70–99)
Potassium: 5.1 mEq/L (ref 3.5–5.1)
Sodium: 140 mEq/L (ref 135–145)
Total Bilirubin: 0.5 mg/dL (ref 0.2–1.2)
Total Protein: 7.1 g/dL (ref 6.0–8.3)

## 2021-05-19 LAB — CBC WITH DIFFERENTIAL/PLATELET
Basophils Absolute: 0 10*3/uL (ref 0.0–0.1)
Basophils Relative: 0.6 % (ref 0.0–3.0)
Eosinophils Absolute: 0.2 10*3/uL (ref 0.0–0.7)
Eosinophils Relative: 3.7 % (ref 0.0–5.0)
HCT: 44.8 % (ref 39.0–52.0)
Hemoglobin: 14.9 g/dL (ref 13.0–17.0)
Lymphocytes Relative: 38.2 % (ref 12.0–46.0)
Lymphs Abs: 1.7 10*3/uL (ref 0.7–4.0)
MCHC: 33.3 g/dL (ref 30.0–36.0)
MCV: 95.2 fl (ref 78.0–100.0)
Monocytes Absolute: 0.6 10*3/uL (ref 0.1–1.0)
Monocytes Relative: 13.9 % — ABNORMAL HIGH (ref 3.0–12.0)
Neutro Abs: 1.9 10*3/uL (ref 1.4–7.7)
Neutrophils Relative %: 43.6 % (ref 43.0–77.0)
Platelets: 242 10*3/uL (ref 150.0–400.0)
RBC: 4.71 Mil/uL (ref 4.22–5.81)
RDW: 12.5 % (ref 11.5–15.5)
WBC: 4.4 10*3/uL (ref 4.0–10.5)

## 2021-05-19 LAB — C-REACTIVE PROTEIN: CRP: <1 mg/dL (ref 0.5–20.0)

## 2021-05-19 LAB — TSH: TSH: 1.48 u[IU]/mL (ref 0.35–5.50)

## 2021-05-19 LAB — SEDIMENTATION RATE: Sed Rate: 7 mm/hr (ref 0–15)

## 2021-05-19 MED ORDER — ALBUTEROL SULFATE HFA 108 (90 BASE) MCG/ACT IN AERS
2.0000 | INHALATION_SPRAY | Freq: Four times a day (QID) | RESPIRATORY_TRACT | 2 refills | Status: DC | PRN
Start: 2021-05-19 — End: 2021-08-31

## 2021-05-19 NOTE — Progress Notes (Signed)
? ?Subjective:  ? ? ? Patient ID: George Pham, male    DOB: 1995/12/13, 26 y.o.   MRN: [id number] ? ?Chief Complaint  ?Patient presents with  ? Transitions Of Care  ? Arthritis  ?  Pt c/o arthritis/firbomyalgia and he has been taking tumeric and he states that seems to help until it wears off.   ? ? ?HPI- ? Fibromyalgia-taking tumeric BID-helps but wears off-taking bid.  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  Doing well.  ?Allergies-allegra.  H/o immunotherapy-stopped about 7 yrs ago and told may all come back. Starting to get worse.  Uses inhaler occ(just found it)-used sev times this past mo but not for yrs ? ?Health Maintenance Due  ?Topic Date Due  ? HPV VACCINES (1 - Male 2-dose series) Never done  ? Hepatitis C Screening  Never done  ? COVID-19 Vaccine (3 - Booster for Pfizer series) 07/25/2019  ? ? ?Past Medical History:  ?Diagnosis Date  ? ADHD (attention deficit hyperactivity disorder)   ? Allergy   ? Bipolar 1 disorder ([place])   ? Followed by Dr. Ronnald Ramp  ? Fibromyalgia   ? ? ?Past Surgical History:  ?Procedure Laterality Date  ? TYMPANOSTOMY TUBE PLACEMENT    ? ? ?Outpatient Medications Prior to Visit  ?Medication Sig Dispense Refill  ? EPINEPHrine (EPI-PEN) 0.3 mg/0.3 mL SOAJ injection Inject into the muscle once.    ? fexofenadine (ALLEGRA) 180 MG tablet Take 180 mg by mouth daily.    ? lidocaine (XYLOCAINE) 5 % ointment APPLY TO AFFECTED AREA AS NEEDED AS DIRECTED 35.44 g 1  ? methocarbamol (ROBAXIN) 500 MG tablet TAKE 1 TABLET BY MOUTH THREE TIMES A DAY AS NEEDED FOR MUSCLE SPASMS 30 tablet 0  ? Multiple Vitamin (MULTIVITAMIN) tablet Take 1 tablet by mouth daily.    ? omeprazole (PRILOSEC) 20 MG capsule Take 20 mg by mouth daily.    ? Turmeric (QC TUMERIC COMPLEX PO) Take 500 mg by mouth.    ? Vitamin D, Ergocalciferol, (DRISDOL) 1.25 MG (50000 UNIT) CAPS capsule TAKE ONE CAPSULE BY MOUTH ONCE A WEEK FOR 8 WEEKS. THEN TAKE 2000IU/DAY 4  capsule 1  ? gabapentin (NEURONTIN) 300 MG capsule TAKE 1 CAPSULE BY MOUTH THREE TIMES A DAY 90 capsule 1  ? cetirizine (ZYRTEC) 10 MG tablet Take 1 tablet (10 mg total) by mouth daily. 30 tablet 11  ? clonazePAM (KLONOPIN) 1 MG tablet Take 1 mg by mouth as needed.     ? divalproex (DEPAKOTE) 125 MG DR tablet     ? ?No facility-administered medications prior to visit.  ? ? ?Allergies  ?Allergen Reactions  ? Peanuts [Peanut Oil]   ?  "Ears turn red."   ? ?ROS neg/noncontributory except as noted HPI/below ?Has vein that pops up on head freq on L temple area(showed me picture). - occ tender side of head ?Had "oozing" spot coming from chin-shiney for sev yrs but not lately. Occ still.  Same spot-no lesion.  Occurring less.   Wisdom teeth removed in Sept 2022.   "Bubble" but would feel back in jaw tender. Located on L only.  ? ?Chronic LBP-tumeric helps.  ?No cp/palp/edema ?GERD-some mild at hs at times.  Takes prilosec.  ?GU-no sig complaints. ? ?Tongue moving constantly-not new ? ? ? ?   ?Objective:  ?  ? ?BP 108/60   Pulse 70   Temp 98 ?F (36.7 ?C)   Ht 5' 10" (1.778 m)   Wt 145  lb 9.6 oz (66 kg)   SpO2 97%   BMI 20.89 kg/m?  ?Wt Readings from Last 3 Encounters:  ?05/19/21 145 lb 9.6 oz (66 kg)  ?05/14/20 141 lb (64 kg)  ?04/24/20 146 lb 9.6 oz (66.5 kg)  ? ? ?Physical Exam  ? ?Gen: WDWN NAD ?HEENT: NCAT, conjunctiva not injected, sclera nonicteric ?TM WNL B, OP moist, no exudates.  No tenderness temples ?No lesion seen on chin(has beard) ?NECK:  supple, no thyromegaly, no nodes, no carotid bruits ?CARDIAC: RRR, S1S2+, no murmur. DP 2+B ?LUNGS: CTAB. No wheezes ?ABDOMEN:  BS+, soft, NTND, No HSM, no masses ?EXT:  no edema ?MSK: no gross abnormalities.  ?NEURO: A&O x3.  CN II-XII intact.  ?PSYCH: normal mood. Good eye contact ? ?   ?Assessment & Plan:  ? ?Problem List Items Addressed This Visit   ? ?  ? Respiratory  ? Seasonal allergic rhinitis due to pollen  ?  ? Other  ? Vitamin D deficiency  ? Relevant Orders   ? VITAMIN D 25 Hydroxy (Vit-D Deficiency, Fractures)  ? Fibromyalgia - Primary  ? ?Other Visit Diagnoses   ? ? Other headache syndrome      ? Relevant Orders  ? Comprehensive metabolic panel  ? TSH  ? CBC with Differential/Platelet  ? Sedimentation rate  ? C-reactive protein  ? ?  ? Fibromyalgia-mostly stable on tumeric-continue.  Add Mg.  Stay active ?Headache/vein L-check cbc,esr,crp, cmp,tsh. ?Vitamin D deficiency-on otc.  Cont.  Check d ?Allergic rhinitis-taking otc allegra.  Found expired albuterol from 2019-uses if sob-will renew. ?Drainage on chin-? Sinus track-I don't see anything.  Better per pt.  Monitor for now ? ?Meds ordered this encounter  ?Medications  ? albuterol (VENTOLIN HFA) 108 (90 Base) MCG/ACT inhaler  ?  Sig: Inhale 2 puffs into the lungs every 6 (six) hours as needed for wheezing or shortness of breath.  ?  Dispense:  8 g  ?  Refill:  2  ? ? ?Wellington Hampshire, MD ? ?

## 2021-05-19 NOTE — Patient Instructions (Signed)
It was very nice to see you today! ? ?Try magnesium 250-400mg /day. ? ? ?PLEASE NOTE: ? ?If you had any lab tests please let us know if you have not heard back within a few days. You may see your results on MyChart before we have a chance to review them but we will give you a call once they are reviewed by [place]. If we ordered any referrals today, please let us know if you have not heard from their office within the next week.  ? ?Please try these tips to maintain a healthy lifestyle: ? ?Eat most of your calories during the day when you are active. Eliminate processed foods including packaged sweets (pies, cakes, cookies), reduce intake of potatoes, white bread, white pasta, and white rice. Look for whole grain options, oat flour or almond flour. ? ?Each meal should contain half fruits/vegetables, one quarter protein, and one quarter carbs (no bigger than a computer mouse). ? ?Cut down on sweet beverages. This includes juice, soda, and sweet tea. Also watch fruit intake, though this is a healthier sweet option, it still contains natural sugar! Limit to 3 servings daily. ? ?Drink at least 1 glass of water with each meal and aim for at least 8 glasses per day ? ?Exercise at least 150 minutes every week.   ?

## 2021-05-20 ENCOUNTER — Encounter: Payer: Self-pay | Admitting: Gastroenterology

## 2021-05-20 ENCOUNTER — Other Ambulatory Visit: Payer: Self-pay

## 2021-05-20 MED ORDER — FEXOFENADINE HCL 180 MG PO TABS: 180.0000 mg | ORAL_TABLET | Freq: Every day | ORAL | 5 refills | Status: DC

## 2021-05-20 MED ORDER — PANTOPRAZOLE SODIUM 40 MG PO TBEC: 40.0000 mg | DELAYED_RELEASE_TABLET | Freq: Two times a day (BID) | ORAL | 3 refills | Status: DC

## 2021-05-20 MED ORDER — FEXOFENADINE HCL 180 MG PO TABS: 180.0000 mg | ORAL_TABLET | Freq: Every day | ORAL | 3 refills | Status: DC

## 2021-07-02 ENCOUNTER — Ambulatory Visit: Payer: BC Managed Care – PPO | Admitting: Gastroenterology

## 2021-07-27 ENCOUNTER — Ambulatory Visit: Payer: BC Managed Care – PPO | Admitting: Gastroenterology

## 2021-07-28 DIAGNOSIS — M9902 Segmental and somatic dysfunction of thoracic region: Secondary | ICD-10-CM | POA: Diagnosis not present

## 2021-07-28 DIAGNOSIS — M9901 Segmental and somatic dysfunction of cervical region: Secondary | ICD-10-CM | POA: Diagnosis not present

## 2021-07-28 DIAGNOSIS — M9904 Segmental and somatic dysfunction of sacral region: Secondary | ICD-10-CM | POA: Diagnosis not present

## 2021-07-28 DIAGNOSIS — M9903 Segmental and somatic dysfunction of lumbar region: Secondary | ICD-10-CM | POA: Diagnosis not present

## 2021-07-28 DIAGNOSIS — M9907 Segmental and somatic dysfunction of upper extremity: Secondary | ICD-10-CM | POA: Diagnosis not present

## 2021-07-28 DIAGNOSIS — M797 Fibromyalgia: Secondary | ICD-10-CM | POA: Diagnosis not present

## 2021-07-30 DIAGNOSIS — M9902 Segmental and somatic dysfunction of thoracic region: Secondary | ICD-10-CM | POA: Diagnosis not present

## 2021-07-30 DIAGNOSIS — M9901 Segmental and somatic dysfunction of cervical region: Secondary | ICD-10-CM | POA: Diagnosis not present

## 2021-07-30 DIAGNOSIS — M9904 Segmental and somatic dysfunction of sacral region: Secondary | ICD-10-CM | POA: Diagnosis not present

## 2021-07-30 DIAGNOSIS — M9903 Segmental and somatic dysfunction of lumbar region: Secondary | ICD-10-CM | POA: Diagnosis not present

## 2021-07-31 ENCOUNTER — Other Ambulatory Visit: Payer: Self-pay | Admitting: Family Medicine

## 2021-07-31 NOTE — Telephone Encounter (Signed)
Left message for patient to return call.

## 2021-08-03 DIAGNOSIS — M9904 Segmental and somatic dysfunction of sacral region: Secondary | ICD-10-CM | POA: Diagnosis not present

## 2021-08-03 DIAGNOSIS — M9903 Segmental and somatic dysfunction of lumbar region: Secondary | ICD-10-CM | POA: Diagnosis not present

## 2021-08-03 DIAGNOSIS — M9901 Segmental and somatic dysfunction of cervical region: Secondary | ICD-10-CM | POA: Diagnosis not present

## 2021-08-03 DIAGNOSIS — M9902 Segmental and somatic dysfunction of thoracic region: Secondary | ICD-10-CM | POA: Diagnosis not present

## 2021-08-05 DIAGNOSIS — M9902 Segmental and somatic dysfunction of thoracic region: Secondary | ICD-10-CM | POA: Diagnosis not present

## 2021-08-05 DIAGNOSIS — M9904 Segmental and somatic dysfunction of sacral region: Secondary | ICD-10-CM | POA: Diagnosis not present

## 2021-08-05 DIAGNOSIS — M9901 Segmental and somatic dysfunction of cervical region: Secondary | ICD-10-CM | POA: Diagnosis not present

## 2021-08-05 DIAGNOSIS — M9903 Segmental and somatic dysfunction of lumbar region: Secondary | ICD-10-CM | POA: Diagnosis not present

## 2021-08-13 DIAGNOSIS — M9901 Segmental and somatic dysfunction of cervical region: Secondary | ICD-10-CM | POA: Diagnosis not present

## 2021-08-13 DIAGNOSIS — M9904 Segmental and somatic dysfunction of sacral region: Secondary | ICD-10-CM | POA: Diagnosis not present

## 2021-08-13 DIAGNOSIS — M9902 Segmental and somatic dysfunction of thoracic region: Secondary | ICD-10-CM | POA: Diagnosis not present

## 2021-08-13 DIAGNOSIS — M9903 Segmental and somatic dysfunction of lumbar region: Secondary | ICD-10-CM | POA: Diagnosis not present

## 2021-08-19 DIAGNOSIS — M9904 Segmental and somatic dysfunction of sacral region: Secondary | ICD-10-CM | POA: Diagnosis not present

## 2021-08-19 DIAGNOSIS — M9903 Segmental and somatic dysfunction of lumbar region: Secondary | ICD-10-CM | POA: Diagnosis not present

## 2021-08-19 DIAGNOSIS — M9901 Segmental and somatic dysfunction of cervical region: Secondary | ICD-10-CM | POA: Diagnosis not present

## 2021-08-19 DIAGNOSIS — M9902 Segmental and somatic dysfunction of thoracic region: Secondary | ICD-10-CM | POA: Diagnosis not present

## 2021-08-20 DIAGNOSIS — M9904 Segmental and somatic dysfunction of sacral region: Secondary | ICD-10-CM | POA: Diagnosis not present

## 2021-08-20 DIAGNOSIS — M9901 Segmental and somatic dysfunction of cervical region: Secondary | ICD-10-CM | POA: Diagnosis not present

## 2021-08-20 DIAGNOSIS — M9903 Segmental and somatic dysfunction of lumbar region: Secondary | ICD-10-CM | POA: Diagnosis not present

## 2021-08-20 DIAGNOSIS — M9902 Segmental and somatic dysfunction of thoracic region: Secondary | ICD-10-CM | POA: Diagnosis not present

## 2021-08-30 ENCOUNTER — Other Ambulatory Visit: Payer: Self-pay | Admitting: Family Medicine

## 2021-08-31 ENCOUNTER — Other Ambulatory Visit: Payer: Self-pay | Admitting: Family Medicine

## 2021-08-31 ENCOUNTER — Other Ambulatory Visit: Payer: Self-pay | Admitting: *Deleted

## 2021-08-31 ENCOUNTER — Telehealth: Payer: Self-pay | Admitting: Family Medicine

## 2021-08-31 MED ORDER — ALBUTEROL SULFATE HFA 108 (90 BASE) MCG/ACT IN AERS: 2.0000 | INHALATION_SPRAY | Freq: Four times a day (QID) | RESPIRATORY_TRACT | 2 refills | Status: DC | PRN

## 2021-08-31 NOTE — Telephone Encounter (Signed)
..   Encourage patient to contact the pharmacy for refills or they can request refills through [facility]  LAST APPOINTMENT DATE:  Please schedule appointment if longer than 1 year  05/19/21  NEXT APPOINTMENT DATE: 05/26/22  MEDICATION:albuterol (VENTOLIN HFA) 108 (90 Base) MCG/ACT inhaler  Is the patient out of medication? Yes  PHARMACY: [id number] - [place], [place] - [address] AT [address] [facility] [address] Phone:  [phone]  Fax:  [phone]      Let patient know to contact pharmacy at the end of the day to make sure medication is ready.  Please notify patient to allow 48-72 hours to process

## 2021-08-31 NOTE — Telephone Encounter (Signed)
Rx sent to the pharmacy. Patient scheduled a follow-up, he stated that he has been using inhaler often, he lost one of them in [place].

## 2021-09-01 ENCOUNTER — Encounter: Payer: Self-pay | Admitting: Family Medicine

## 2021-09-01 ENCOUNTER — Ambulatory Visit (INDEPENDENT_AMBULATORY_CARE_PROVIDER_SITE_OTHER): Payer: BC Managed Care – PPO | Admitting: Family Medicine

## 2021-09-01 VITALS — BP 116/72 | HR 81 | Temp 99.0°F | Ht 70.0 in | Wt 146.0 lb

## 2021-09-01 DIAGNOSIS — K219 Gastro-esophageal reflux disease without esophagitis: Secondary | ICD-10-CM | POA: Diagnosis not present

## 2021-09-01 DIAGNOSIS — J301 Allergic rhinitis due to pollen: Secondary | ICD-10-CM

## 2021-09-01 DIAGNOSIS — J4530 Mild persistent asthma, uncomplicated: Secondary | ICD-10-CM

## 2021-09-01 MED ORDER — FEXOFENADINE HCL 180 MG PO TABS: 180.0000 mg | ORAL_TABLET | Freq: Every day | ORAL | 3 refills | Status: AC

## 2021-09-01 MED ORDER — ALBUTEROL SULFATE HFA 108 (90 BASE) MCG/ACT IN AERS: 2.0000 | INHALATION_SPRAY | Freq: Four times a day (QID) | RESPIRATORY_TRACT | 2 refills | Status: DC | PRN

## 2021-09-01 MED ORDER — PANTOPRAZOLE SODIUM 40 MG PO TBEC: 40.0000 mg | DELAYED_RELEASE_TABLET | Freq: Two times a day (BID) | ORAL | 3 refills | Status: AC

## 2021-09-01 MED ORDER — MONTELUKAST SODIUM 10 MG PO TABS: 10.0000 mg | ORAL_TABLET | Freq: Every day | ORAL | 3 refills | Status: AC

## 2021-09-01 MED ORDER — EPINEPHRINE 0.3 MG/0.3ML IJ SOAJ: 0.3000 mg | Freq: Once | INTRAMUSCULAR | 1 refills | Status: AC

## 2021-09-01 NOTE — Patient Instructions (Signed)
It was very nice to see you today!  Good luck   PLEASE NOTE:  If you had any lab tests please let us know if you have not heard back within a few days. You may see your results on MyChart before we have a chance to review them but we will give you a call once they are reviewed by us. If we ordered any referrals today, please let us know if you have not heard from their office within the next week.   Please try these tips to maintain a healthy lifestyle:  Eat most of your calories during the day when you are active. Eliminate processed foods including packaged sweets (pies, cakes, cookies), reduce intake of potatoes, white bread, white pasta, and white rice. Look for whole grain options, oat flour or almond flour.  Each meal should contain half fruits/vegetables, one quarter protein, and one quarter carbs (no bigger than a computer mouse).  Cut down on sweet beverages. This includes juice, soda, and sweet tea. Also watch fruit intake, though this is a healthier sweet option, it still contains natural sugar! Limit to 3 servings daily.  Drink at least 1 glass of water with each meal and aim for at least 8 glasses per day  Exercise at least 150 minutes every week.   

## 2021-09-01 NOTE — Progress Notes (Signed)
Subjective:     Patient ID: George Pham, male    DOB: 04/01/1995, 26 y.o.   MRN: [id number]  Chief Complaint  Patient presents with   Follow-up    Follow-up on asthma, has been using inhaler more often due to weather conditions Moving will need meds refilled, moving to [place]   Dizziness    Feeling lightheaded for a couple of days    HPI-moving to [place]-fiancee, change 1  Asthma/allergies.  Immunotherapy in past.  Has been about 7 yrs, getting worse.  Hard to breathe w/the air pollution lately. Throat feels irrit. May use albuterol sev x/day, and then days w/o.  Singulair in past 2.  Bipolar-moods doing ok w/o meds.  No SI 3.  Dizziness-has lost some wt.  Not eating a lot. 4.  GERD-taking protonix bid.  Helps breathing as well.    Health Maintenance Due  Topic Date Due   Hepatitis C Screening  Never done    Past Medical History:  Diagnosis Date   ADHD (attention deficit hyperactivity disorder)    Allergy    Bipolar 1 disorder ([facility])    Followed by Dr. Yetta Barre   Fibromyalgia     Past Surgical History:  Procedure Laterality Date   TYMPANOSTOMY TUBE PLACEMENT      Outpatient Medications Prior to Visit  Medication Sig Dispense Refill   Cholecalciferol (D3 PO) Take by mouth daily.     lidocaine (XYLOCAINE) 5 % ointment APPLY TO AFFECTED AREA AS NEEDED AS DIRECTED 35.44 g 1   Multiple Vitamin (MULTIVITAMIN) tablet Take 1 tablet by mouth daily.     Turmeric (QC TUMERIC COMPLEX PO) Take 500 mg by mouth.     EPINEPHrine (EPI-PEN) 0.3 mg/0.3 mL SOAJ injection Inject into the muscle once.     fexofenadine (ALLEGRA) 180 MG tablet Take 1 tablet (180 mg total) by mouth daily. 90 tablet 3   levalbuterol (XOPENEX HFA) 45 MCG/ACT inhaler Inhale 2 puffs into the lungs every 6 (six) hours as needed for wheezing. 1 each 0   pantoprazole (PROTONIX) 40 MG tablet Take 1 tablet (40 mg total) by mouth 2 (two) times daily. 180 tablet 3   methocarbamol (ROBAXIN) 500 MG tablet TAKE 1 TABLET BY  MOUTH THREE TIMES A DAY AS NEEDED FOR MUSCLE SPASMS (Patient not taking: Reported on 09/01/2021) 30 tablet 0   omeprazole (PRILOSEC) 20 MG capsule Take 20 mg by mouth daily. (Patient not taking: Reported on 09/01/2021)     Vitamin D, Ergocalciferol, (DRISDOL) 1.25 MG (50000 UNIT) CAPS capsule TAKE ONE CAPSULE BY MOUTH ONCE A WEEK FOR 8 WEEKS. THEN TAKE 2000IU/DAY (Patient not taking: Reported on 09/01/2021) 4 capsule 1   No facility-administered medications prior to visit.    Allergies  Allergen Reactions   Peanuts [Peanut Oil]     "Ears turn red."    ROS neg/noncontributory except as noted HPI/below      Objective:     BP 116/72   Pulse 81   Temp 99 F (37.2 C) (Temporal)   Ht 5\' 10"  (1.778 m)   Wt 146 lb (66.2 kg)   SpO2 98%   BMI 20.95 kg/m  Wt Readings from Last 3 Encounters:  09/01/21 146 lb (66.2 kg)  05/19/21 145 lb 9.6 oz (66 kg)  05/14/20 141 lb (64 kg)    Physical Exam   Gen: WDWN NAD HEENT: NCAT, conjunctiva not injected, sclera nonicteric NECK:  supple, no thyromegaly, no nodes, no carotid bruits CARDIAC: RRR, S1S2+, no  murmur. DP 2+B LUNGS: CTAB. No wheezes ABDOMEN:  BS+, soft, NTND, No HSM, no masses EXT:  no edema MSK: no gross abnormalities.  NEURO: A&O x3.  CN II-XII intact.  PSYCH: normal mood. Good eye contact     Assessment & Plan:   Problem List Items Addressed This Visit       Respiratory   Seasonal allergic rhinitis due to pollen - Primary   Mild persistent asthma without complication   Relevant Medications   montelukast (SINGULAIR) 10 MG tablet   albuterol (VENTOLIN HFA) 108 (90 Base) MCG/ACT inhaler     Digestive   Gastroesophageal reflux disease without esophagitis   Relevant Medications   pantoprazole (PROTONIX) 40 MG tablet   Allergies/asthma-chronic. not well controlled.  Cont allegra 180mg .  Add singulair 10mg .  Renewed epi pen.  Renewed albuterol inhaler.  Consider ICS. GERD-chronic.  Controlled on pantaprazole 40mg   bid-cont.  May be contributing to asthma as well.  Pt will call for f/u as moving to [facility] and may or may not be back  Meds ordered this encounter  Medications   EPINEPHrine 0.3 mg/0.3 mL IJ SOAJ injection    Sig: Inject 0.3 mg into the muscle once for 1 dose.    Dispense:  1 each    Refill:  1   fexofenadine (ALLEGRA) 180 MG tablet    Sig: Take 1 tablet (180 mg total) by mouth daily.    Dispense:  90 tablet    Refill:  3   pantoprazole (PROTONIX) 40 MG tablet    Sig: Take 1 tablet (40 mg total) by mouth 2 (two) times daily.    Dispense:  180 tablet    Refill:  3   montelukast (SINGULAIR) 10 MG tablet    Sig: Take 1 tablet (10 mg total) by mouth at bedtime.    Dispense:  90 tablet    Refill:  3   albuterol (VENTOLIN HFA) 108 (90 Base) MCG/ACT inhaler    Sig: Inhale 2 puffs into the lungs every 6 (six) hours as needed for wheezing or shortness of breath.    Dispense:  18 g    Refill:  2    , MD

## 2021-09-15 IMAGING — DX DG LUMBAR SPINE COMPLETE 4+V
4 series · 4 of 4 positions shown · non-contrast
Comparison: None.

CLINICAL DATA: Back pain after fall

EXAM:
LUMBAR SPINE - COMPLETE 4+ VIEW

[lumbar spine ap]
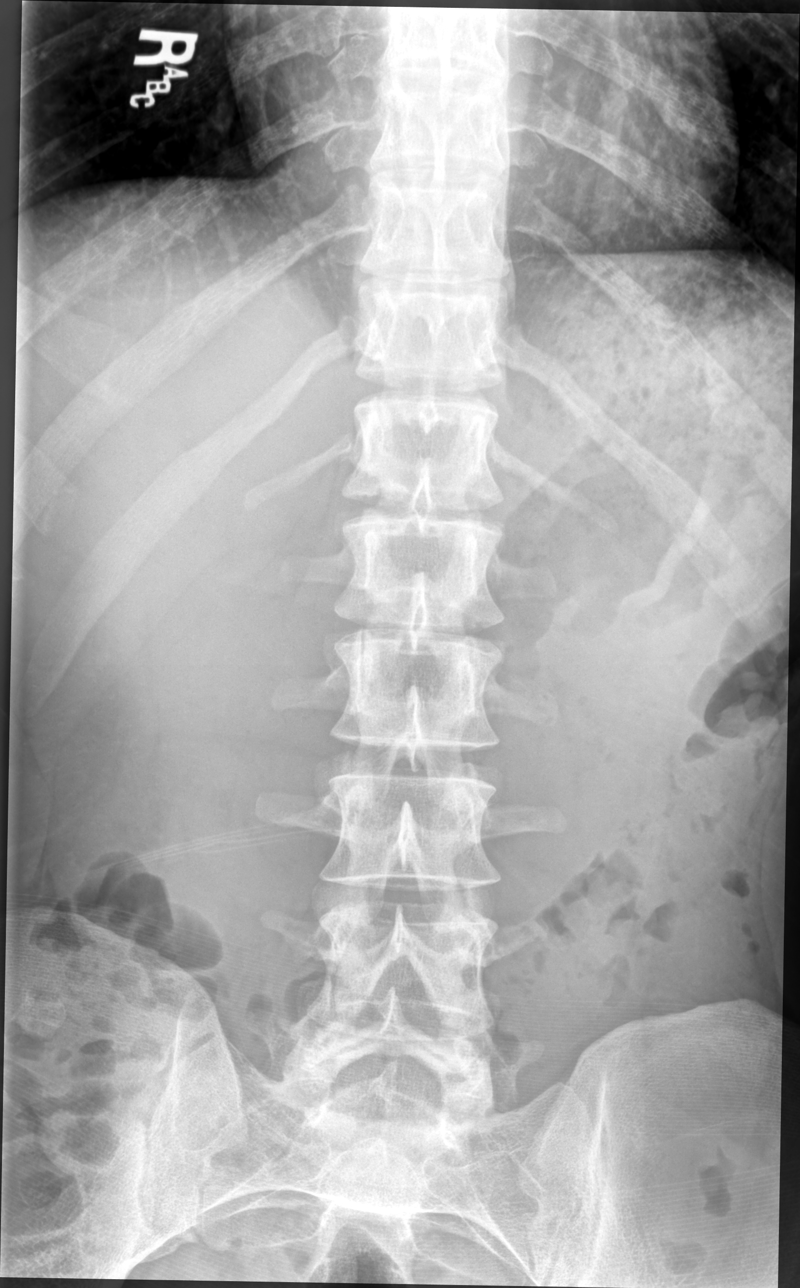

[lumbar spine oblique (1 of 2)]
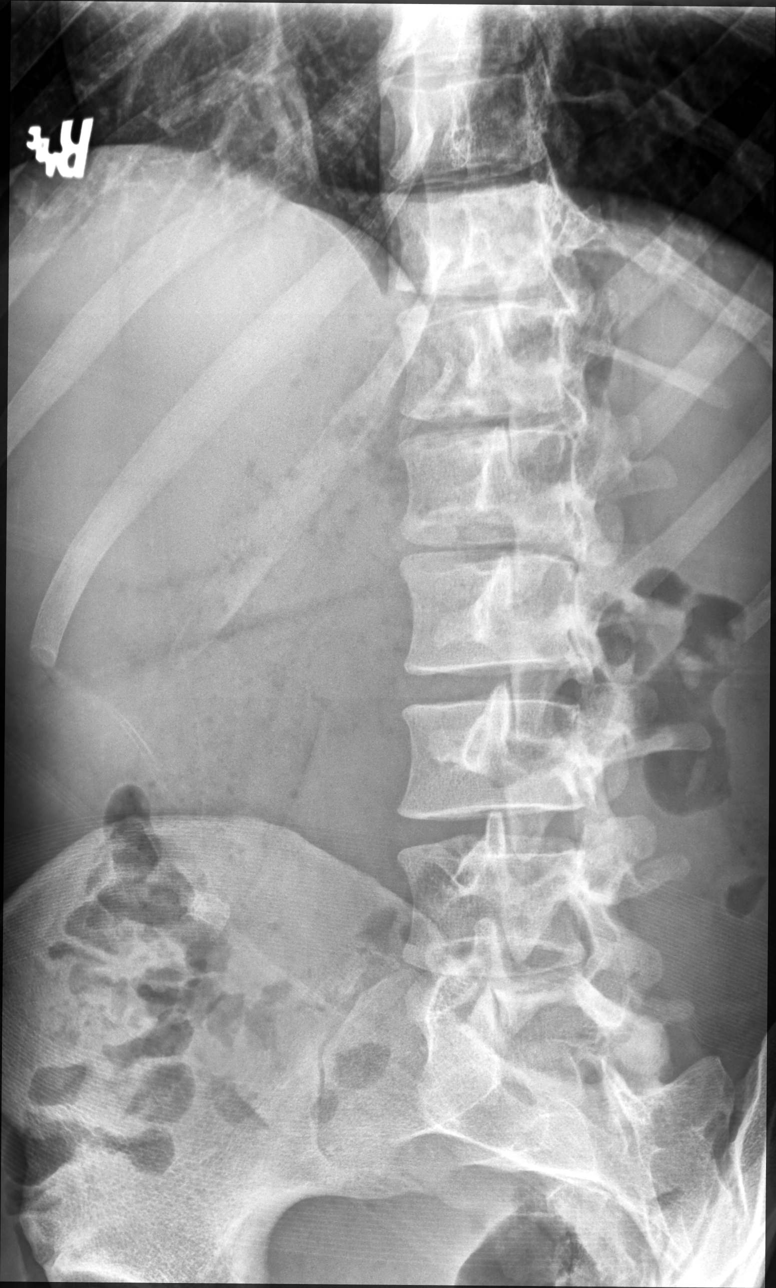

[lumbar spine oblique (2 of 2)]
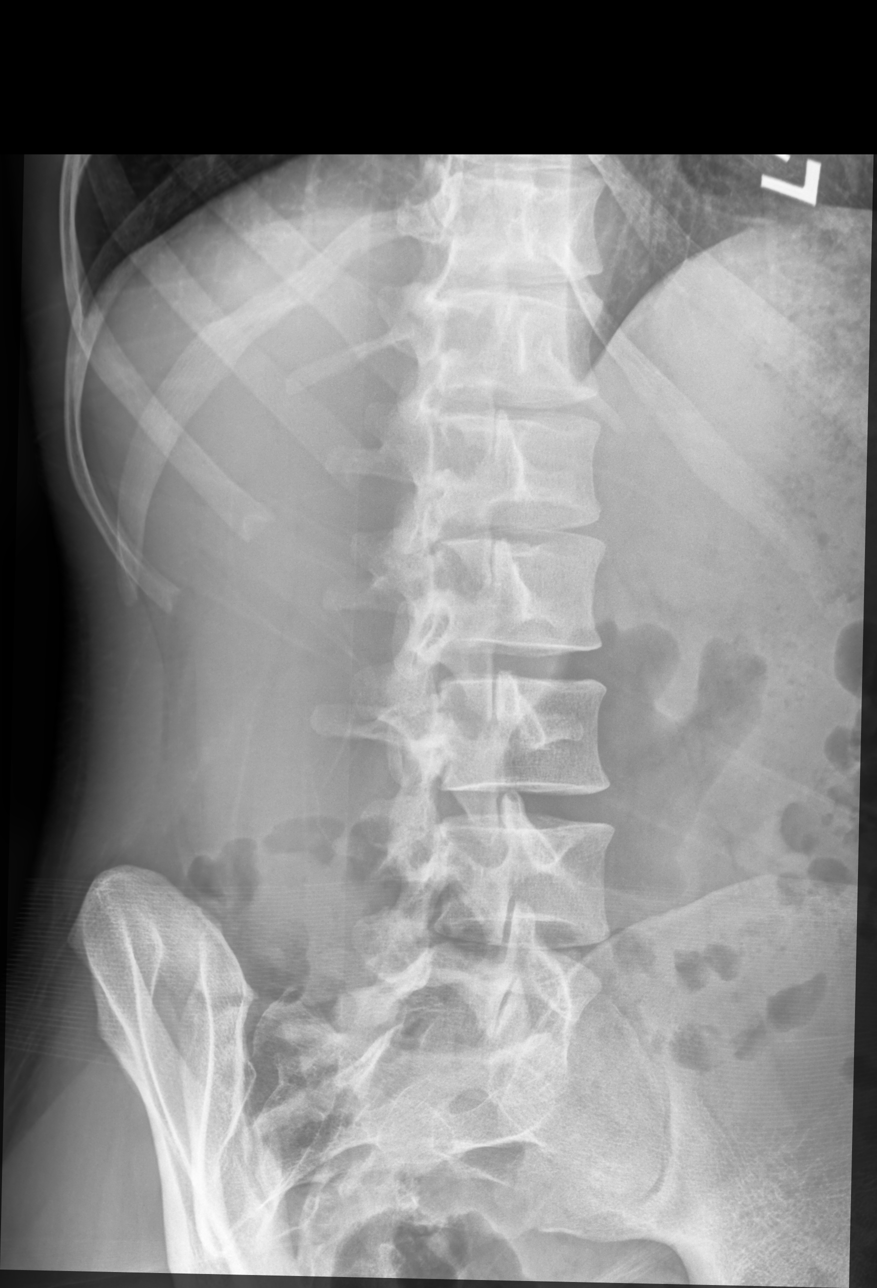

[lumbar spine lat]
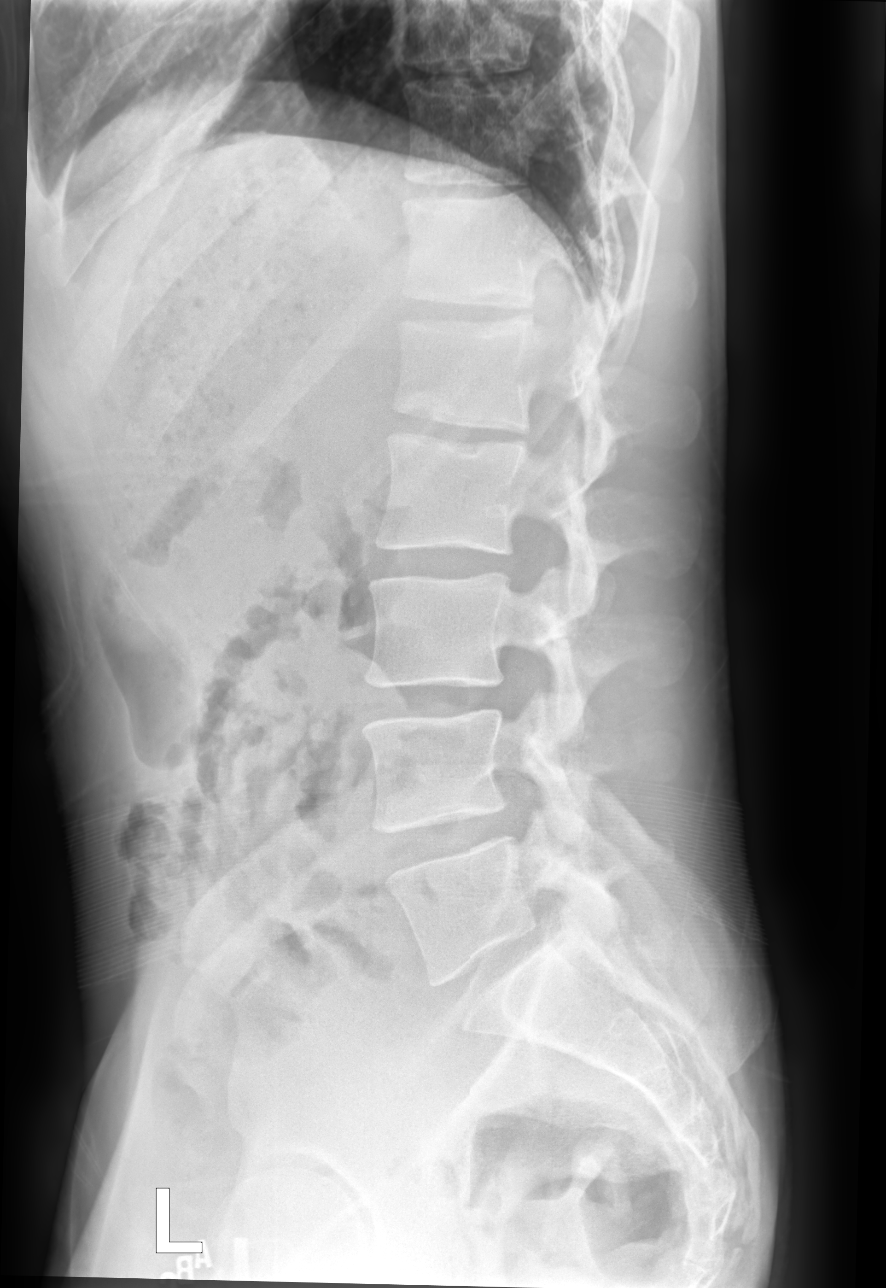

[4 of 4 positions shown; findings below may reference images not displayed]

FINDINGS: There is no evidence of lumbar spine fracture. Alignment is normal.
Intervertebral disc spaces are maintained.
IMPRESSION: Negative.

## 2021-10-20 ENCOUNTER — Other Ambulatory Visit: Payer: Self-pay | Admitting: Family Medicine

## 2021-10-20 ENCOUNTER — Encounter: Payer: Self-pay | Admitting: Family Medicine

## 2021-10-20 DIAGNOSIS — K602 Anal fissure, unspecified: Secondary | ICD-10-CM

## 2021-10-20 MED ORDER — LIDOCAINE 5 % EX OINT: TOPICAL_OINTMENT | CUTANEOUS | 1 refills | Status: DC

## 2021-11-02 ENCOUNTER — Encounter: Payer: Self-pay | Admitting: *Deleted

## 2022-01-21 ENCOUNTER — Encounter: Payer: Self-pay | Admitting: *Deleted

## 2022-02-16 ENCOUNTER — Telehealth: Payer: Self-pay | Admitting: *Deleted

## 2022-02-16 ENCOUNTER — Other Ambulatory Visit: Payer: Self-pay | Admitting: Family Medicine

## 2022-02-16 DIAGNOSIS — K602 Anal fissure, unspecified: Secondary | ICD-10-CM

## 2022-02-16 MED ORDER — LIDOCAINE 5 % EX OINT: TOPICAL_OINTMENT | CUTANEOUS | 1 refills | Status: AC

## 2022-02-16 MED ORDER — ALBUTEROL SULFATE HFA 108 (90 BASE) MCG/ACT IN AERS: 2.0000 | INHALATION_SPRAY | Freq: Four times a day (QID) | RESPIRATORY_TRACT | 2 refills | Status: AC | PRN

## 2022-02-16 NOTE — Telephone Encounter (Signed)
Pharmacy sent over fax requesting refills for albuterol HFA Inhaler, lidocaine 5% ointment and Levalbuterol TAR HFA inhaler. Please advise.

## 2022-02-17 NOTE — Telephone Encounter (Signed)
Left message to return call 

## 2022-02-18 NOTE — Telephone Encounter (Signed)
Patient notified. Patient stated that he was sent a message about refilling medication and he said yes for all. Okay with the albuterol.

## 2022-05-11 ENCOUNTER — Other Ambulatory Visit: Payer: Self-pay | Admitting: *Deleted

## 2022-05-11 MED ORDER — EPINEPHRINE 0.3 MG/0.3ML IJ SOAJ: 0.3000 mg | Freq: Once | INTRAMUSCULAR | 0 refills | Status: AC

## 2022-05-26 ENCOUNTER — Encounter: Payer: BC Managed Care – PPO | Admitting: Family Medicine
